# Patient Record
Sex: Female | Born: 1990 | Race: White | Hispanic: No | Marital: Single | Smoking: Current every day smoker
Health system: Southern US, Community
[De-identification: ages and names within clinical notes are randomized; demographics above are authoritative.]

## PROBLEM LIST (undated history)

## (undated) DIAGNOSIS — E109 Type 1 diabetes mellitus without complications: Secondary | ICD-10-CM

## (undated) DIAGNOSIS — F419 Anxiety disorder, unspecified: Secondary | ICD-10-CM

## (undated) HISTORY — PX: NO PAST SURGERIES: SHX2092

## (undated) HISTORY — PX: LAPAROSCOPY: SHX197

## (undated) HISTORY — PX: INDUCED ABORTION: SHX677

---
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
## 2010-03-10 ENCOUNTER — Ambulatory Visit: Payer: Self-pay | Admitting: Internal Medicine

## 2010-10-28 ENCOUNTER — Ambulatory Visit: Payer: Self-pay

## 2010-11-04 ENCOUNTER — Ambulatory Visit: Payer: Self-pay

## 2010-11-21 ENCOUNTER — Ambulatory Visit: Payer: Self-pay | Admitting: Internal Medicine

## 2010-12-11 IMAGING — CR LEFT GREAT TOE
1 series · 3 of 3 positions shown · non-contrast
Comparison: none

REASON FOR EXAM: injury, pain
COMMENTS:

PROCEDURE:     MDR - MDR TOE GREAT (1ST DIGIT) LEFT  - May 08, 2009  [DATE]
RESULT:     Three views of the left great toe reveal the bones to be
adequately mineralized. I do not see evidence of an acute fracture nor
dislocation. The overlying soft tissues are grossly normal.

[Series 1: view not recorded · 0.17mm/px · 3 of 3 slices shown]
[im 1/3]
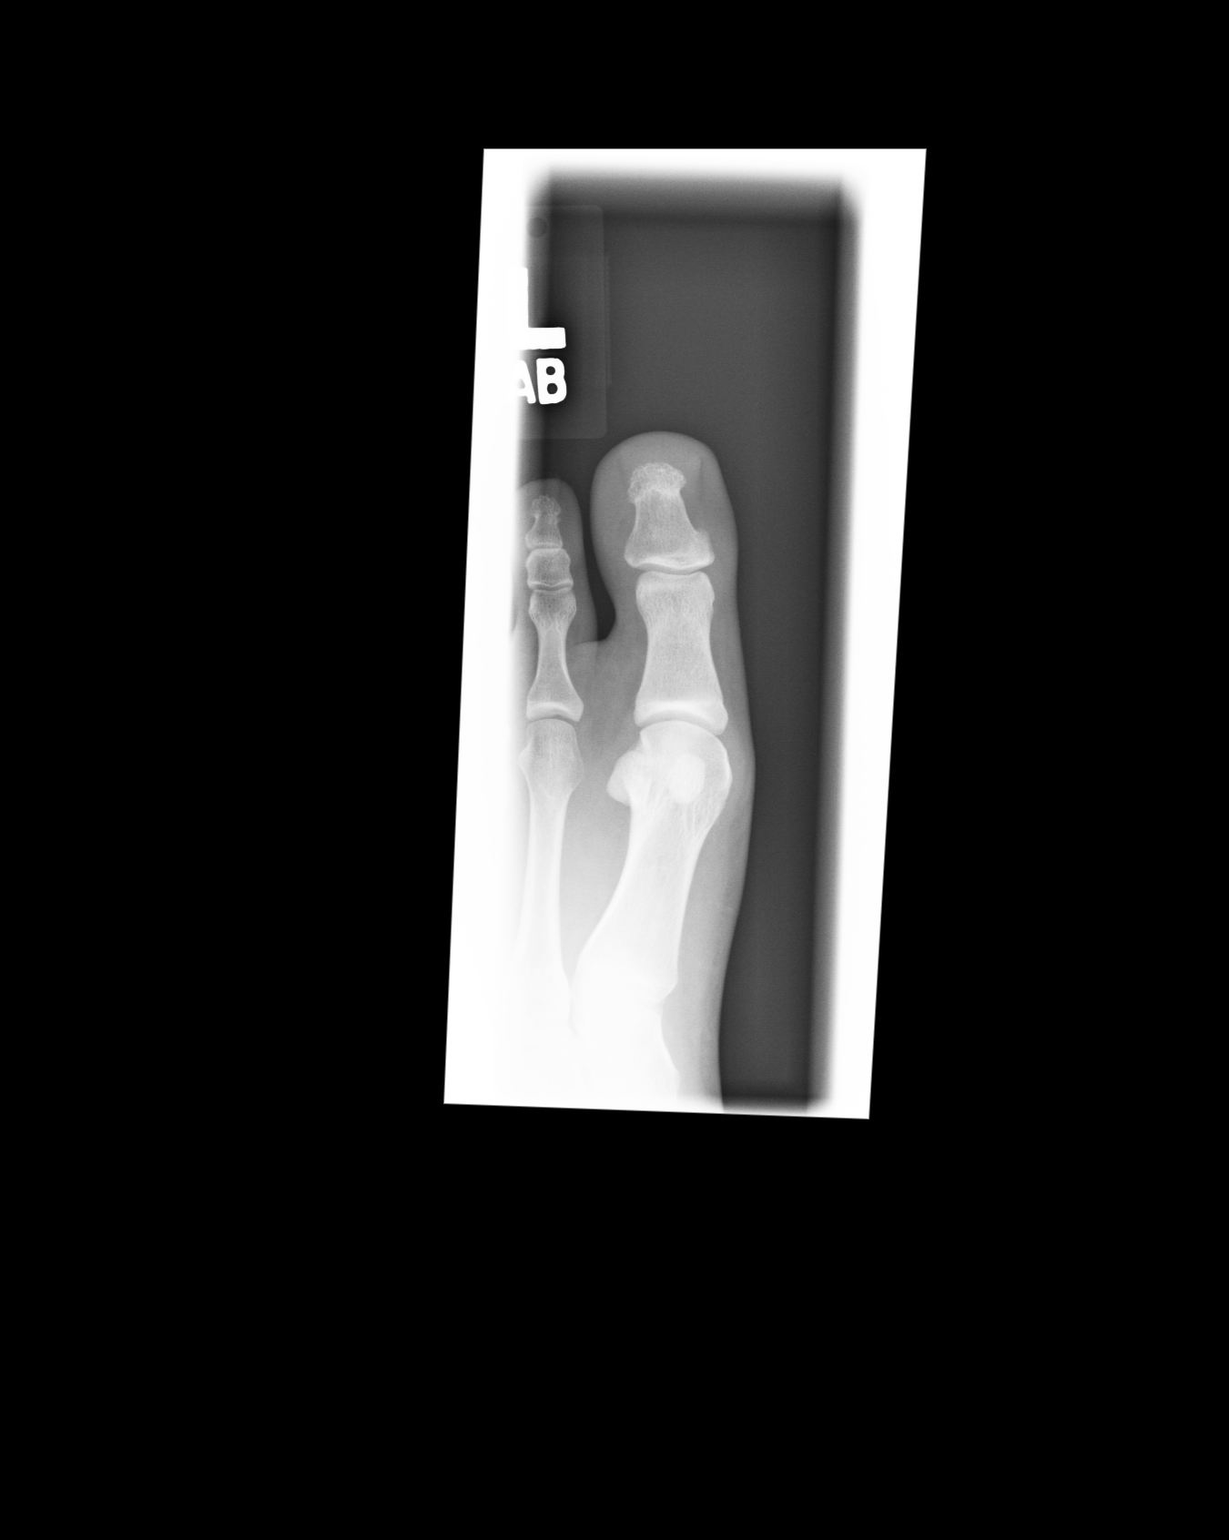
[im 2/3]
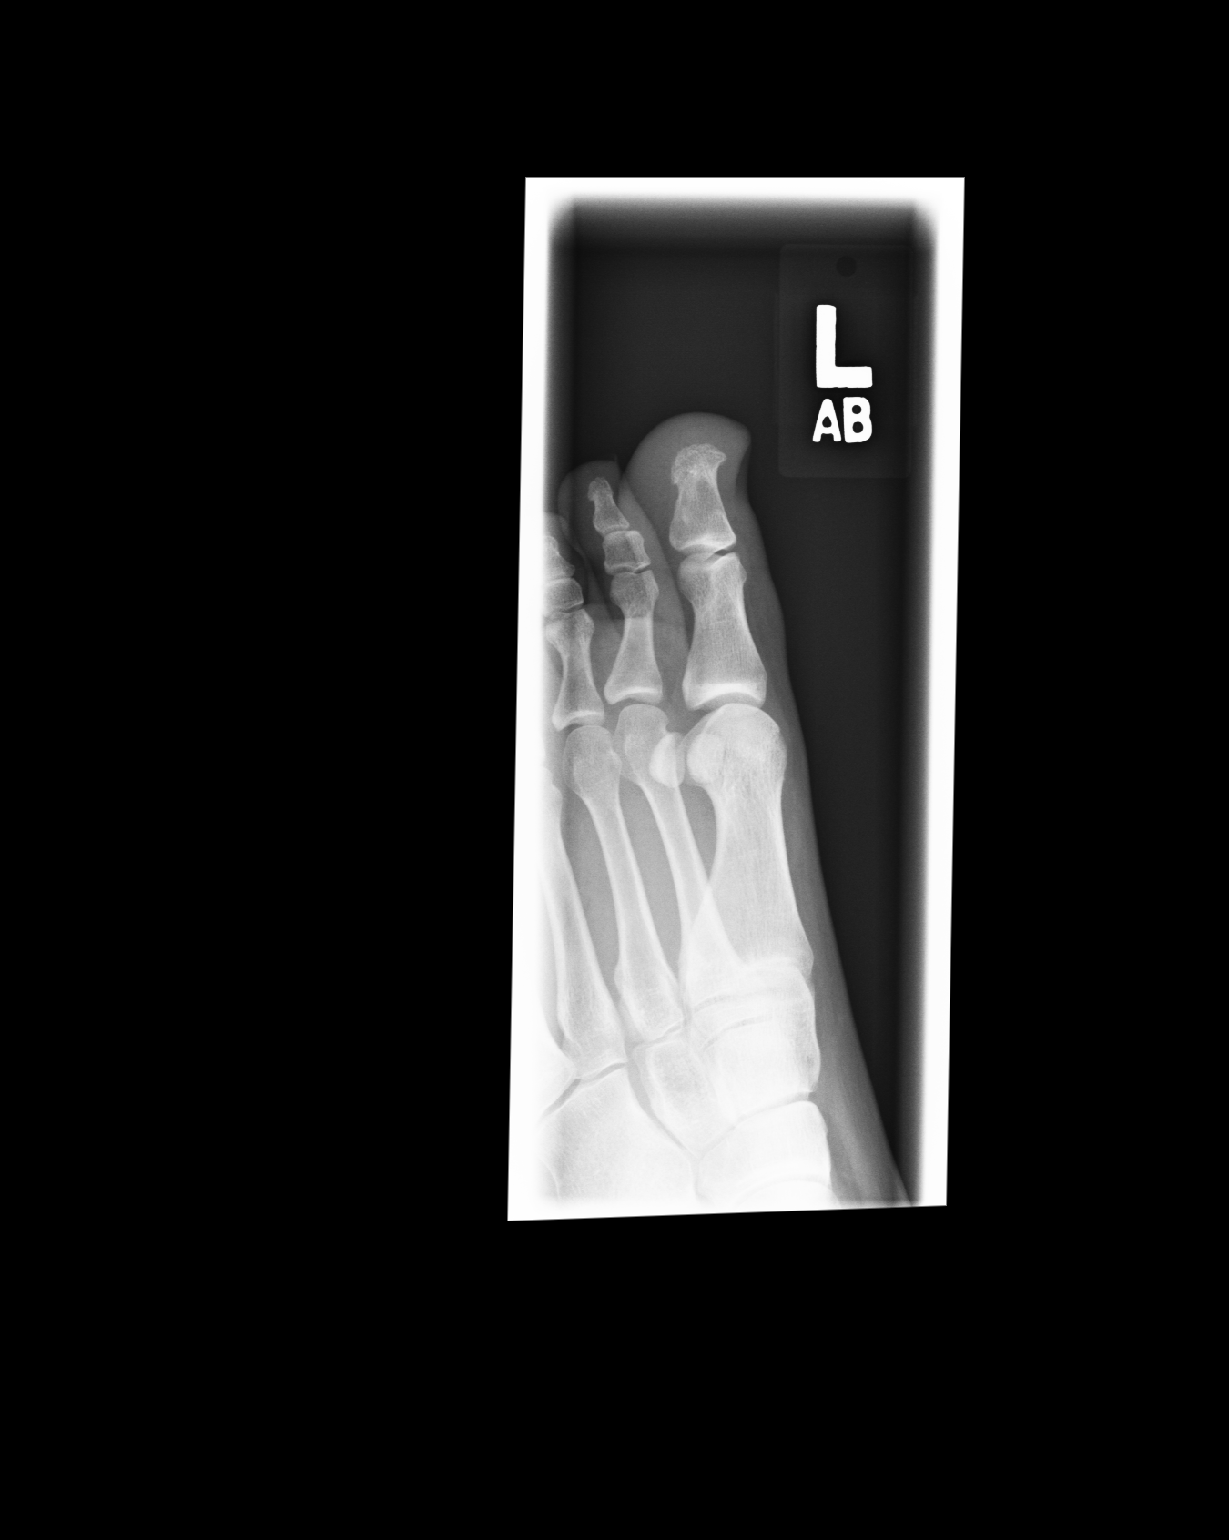
[im 3/3]
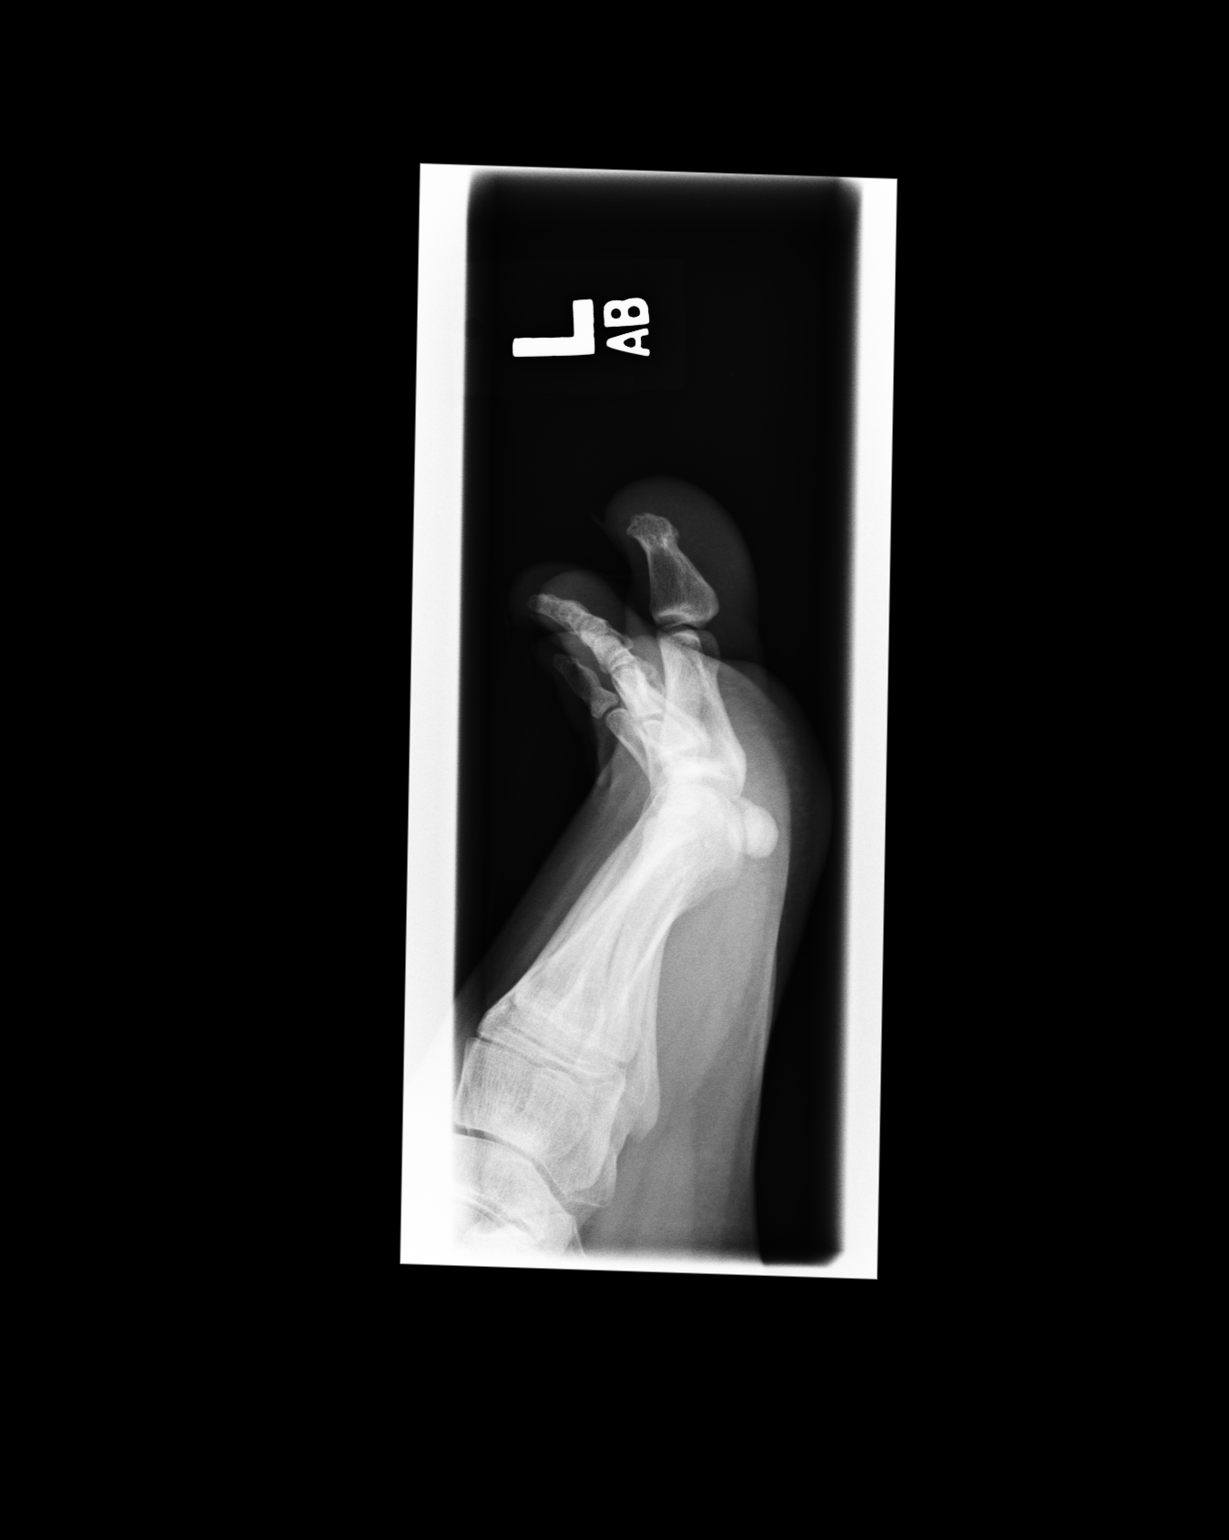

[3 of 3 positions shown; findings below may reference images not displayed]

IMPRESSION: I do not see acute bony abnormality of the great toe.

## 2011-07-01 LAB — URINALYSIS, ROUTINE W REFLEX MICROSCOPIC
Bilirubin Urine: NEGATIVE
Glucose, UA: >1000 — AB
Hgb urine dipstick: NEGATIVE
Ketones, ur: >80 — AB
Leukocytes, UA: NEGATIVE
Nitrite: NEGATIVE
Protein, ur: NEGATIVE
Specific Gravity, Urine: 1.031 — ABNORMAL HIGH
Urobilinogen, UA: 0.2
pH: 5.5

## 2011-07-01 LAB — CBC
HCT: 43.7
Hemoglobin: 15.6
RBC: 4.88
WBC: 12.1

## 2011-07-01 LAB — RAPID URINE DRUG SCREEN, HOSP PERFORMED
Amphetamines: NOT DETECTED
Barbiturates: NOT DETECTED
Benzodiazepines: NOT DETECTED
Cocaine: NOT DETECTED
Opiates: NOT DETECTED
Tetrahydrocannabinol: POSITIVE — AB

## 2011-07-01 LAB — POCT PREGNANCY, URINE
Operator id: 288331
Preg Test, Ur: NEGATIVE

## 2011-07-01 LAB — BASIC METABOLIC PANEL
Potassium: 3.9
Sodium: 140

## 2011-07-01 LAB — URINE CULTURE: Colony Count: 4000

## 2011-07-01 LAB — URINE MICROSCOPIC-ADD ON

## 2013-09-16 ENCOUNTER — Ambulatory Visit: Payer: Self-pay | Admitting: Family Medicine

## 2013-09-16 LAB — CBC WITH DIFFERENTIAL/PLATELET
Basophil #: 0 10*3/uL (ref 0.0–0.1)
Basophil %: 0.4 %
Eosinophil %: 1.1 %
HCT: 42.2 % (ref 35.0–47.0)
HGB: 14.8 g/dL (ref 12.0–16.0)
Lymphocyte %: 32.6 %
MCH: 30.7 pg (ref 26.0–34.0)
MCHC: 35 g/dL (ref 32.0–36.0)
Neutrophil #: 5 10*3/uL (ref 1.4–6.5)
Neutrophil %: 60.1 %
Platelet: 230 10*3/uL (ref 150–440)

## 2013-09-16 LAB — COMPREHENSIVE METABOLIC PANEL
Albumin: 3.8 g/dL (ref 3.4–5.0)
BUN: 13 mg/dL (ref 7–18)
Bilirubin,Total: 0.4 mg/dL (ref 0.2–1.0)
Co2: 30 mmol/L (ref 21–32)
Creatinine: 1.03 mg/dL (ref 0.60–1.30)
EGFR (African American): >60
EGFR (Non-African Amer.): >60
Glucose: 314 mg/dL — ABNORMAL HIGH (ref 65–99)
Total Protein: 7.3 g/dL (ref 6.4–8.2)

## 2013-09-16 LAB — URINALYSIS, COMPLETE
Ph: 6 (ref 4.5–8.0)
Protein: NEGATIVE

## 2013-09-16 LAB — PREGNANCY, URINE: Pregnancy Test, Urine: NEGATIVE m[IU]/mL

## 2014-04-27 ENCOUNTER — Ambulatory Visit: Payer: Self-pay | Admitting: Physician Assistant

## 2014-04-27 LAB — PREGNANCY, URINE: PREGNANCY TEST, URINE: NEGATIVE m[IU]/mL

## 2016-11-03 ENCOUNTER — Ambulatory Visit
Admission: EM | Admit: 2016-11-03 | Discharge: 2016-11-03 | Disposition: A | Discharge status: Home / Self Care | Payer: Medicaid Other | Attending: Family Medicine | Admitting: Family Medicine

## 2016-11-03 DIAGNOSIS — R69 Illness, unspecified: Secondary | ICD-10-CM | POA: Diagnosis not present

## 2016-11-03 DIAGNOSIS — J9801 Acute bronchospasm: Secondary | ICD-10-CM | POA: Diagnosis not present

## 2016-11-03 DIAGNOSIS — R110 Nausea: Secondary | ICD-10-CM

## 2016-11-03 DIAGNOSIS — J111 Influenza due to unidentified influenza virus with other respiratory manifestations: Secondary | ICD-10-CM

## 2016-11-03 HISTORY — DX: Type 1 diabetes mellitus without complications: E10.9

## 2016-11-03 MED ORDER — OSELTAMIVIR PHOSPHATE 75 MG PO CAPS: 75.0000 mg | ORAL_CAPSULE | Freq: Two times a day (BID) | ORAL | 0 refills | Status: DC

## 2016-11-03 MED ORDER — ALBUTEROL SULFATE HFA 108 (90 BASE) MCG/ACT IN AERS: 1.0000 | INHALATION_SPRAY | Freq: Four times a day (QID) | RESPIRATORY_TRACT | 0 refills | Status: DC | PRN

## 2016-11-03 MED ORDER — ONDANSETRON 8 MG PO TBDP: 8.0000 mg | ORAL_TABLET | Freq: Three times a day (TID) | ORAL | 0 refills | Status: DC | PRN

## 2016-11-03 NOTE — ED Provider Notes (Signed)
[facility]    CSN: [id number] Arrival date & time: 11/03/16  1524     History   Chief Complaint Chief Complaint  Patient presents with  . Cough    HPI Misty Nelson is a 26 y.o. female.    Cough  Associated symptoms: fever, myalgias and rhinorrhea   URI  Presenting symptoms: congestion, cough, fatigue, fever and rhinorrhea   Severity:  Moderate Onset quality:  Sudden Duration:  3 days Timing:  Constant Progression:  Worsening Chronicity:  New Relieved by:  None tried Ineffective treatments:  None tried Associated symptoms: myalgias   Risk factors: sick contacts (son diagnosed with flu)   Risk factors: not elderly, no chronic cardiac disease, no chronic kidney disease, no chronic respiratory disease, no diabetes mellitus, no immunosuppression, no recent illness and no recent travel     Past Medical History:  Diagnosis Date  . Type 1 diabetes (HCC)     There are no active problems to display for this patient.   Past Surgical History:  Procedure Laterality Date  . NO PAST SURGERIES      OB History    No data available       Home Medications    Prior to Admission medications   Medication Sig Start Date End Date Taking? Authorizing Provider  insulin aspart (NOVOLOG) 100 UNIT/ML injection Inject into the skin 3 (three) times daily before meals.   Yes Historical Provider, MD  insulin glargine (LANTUS) 100 UNIT/ML injection Inject 30 Units into the skin at bedtime.   Yes Historical Provider, MD  albuterol (PROVENTIL HFA;VENTOLIN HFA) 108 (90 Base) MCG/ACT inhaler Inhale 1-2 puffs into the lungs every 6 (six) hours as needed for wheezing or shortness of breath. 11/03/16   Payton Mccallum, MD  ondansetron (ZOFRAN ODT) 8 MG disintegrating tablet Take 1 tablet (8 mg total) by mouth every 8 (eight) hours as needed for nausea or vomiting. 11/03/16   Payton Mccallum, MD  oseltamivir (TAMIFLU) 75 MG capsule Take 1 capsule (75 mg total) by mouth 2 (two) times  daily. 11/03/16   Payton Mccallum, MD    Family History History reviewed. No pertinent family history.  Social History Social History  Substance Use Topics  . Smoking status: Current Every Day Smoker    Packs/day: 0.50  . Smokeless tobacco: Never Used  . Alcohol use No     Allergies   Patient has no known allergies.   Review of Systems Review of Systems  Constitutional: Positive for fatigue and fever.  HENT: Positive for congestion and rhinorrhea.   Respiratory: Positive for cough.   Musculoskeletal: Positive for myalgias.     Physical Exam Triage Vital Signs ED Triage Vitals  Enc Vitals Group     BP 11/03/16 1552 108/77     Pulse Rate 11/03/16 1552 80     Resp 11/03/16 1552 18     Temp 11/03/16 1552 97.9 F (36.6 C)     Temp Source 11/03/16 1552 Oral     SpO2 11/03/16 1552 95 %     Weight 11/03/16 1550 130 lb (59 kg)     Height 11/03/16 1550 5\' 1"  (1.549 m)     Head Circumference --      Peak Flow --      Pain Score 11/03/16 1552 7     Pain Loc --      Pain Edu? --      Excl. in GC? --    No data found.  Updated Vital Signs BP 108/77 (BP Location: Left Arm)   Pulse 80   Temp 97.9 F (36.6 C) (Oral)   Resp 18   Ht 5\' 1"  (1.549 m)   Wt 130 lb (59 kg)   LMP 10/13/2016   SpO2 95%   BMI 24.56 kg/m   Visual Acuity Right Eye Distance:   Left Eye Distance:   Bilateral Distance:    Right Eye Near:   Left Eye Near:    Bilateral Near:     Physical Exam  Constitutional: She appears well-developed and well-nourished. No distress.  HENT:  Head: Normocephalic and atraumatic.  Right Ear: Tympanic membrane, external ear and ear canal normal.  Left Ear: Tympanic membrane, external ear and ear canal normal.  Nose: Mucosal edema and rhinorrhea present. No nose lacerations, sinus tenderness, nasal deformity, septal deviation or nasal septal hematoma. No epistaxis.  No foreign bodies. Right sinus exhibits no maxillary sinus tenderness and no frontal sinus  tenderness. Left sinus exhibits no maxillary sinus tenderness and no frontal sinus tenderness.  Mouth/Throat: Uvula is midline, oropharynx is clear and moist and mucous membranes are normal. No oropharyngeal exudate.  Eyes: Conjunctivae and EOM are normal. Pupils are equal, round, and reactive to light. Right eye exhibits no discharge. Left eye exhibits no discharge. No scleral icterus.  Neck: Normal range of motion. Neck supple. No thyromegaly present.  Cardiovascular: Normal rate, regular rhythm and normal heart sounds.   Pulmonary/Chest: Effort normal and breath sounds normal. No respiratory distress. She has no wheezes. She has no rales.  Lymphadenopathy:    She has no cervical adenopathy.  Skin: She is not diaphoretic.  Nursing note and vitals reviewed.    UC Treatments / Results  Labs (all labs ordered are listed, but only abnormal results are displayed) Labs Reviewed - No data to display  EKG  EKG Interpretation None       Radiology No results found.  Procedures Procedures (including critical care time)  Medications Ordered in UC Medications - No data to display   Initial Impression / Assessment and Plan / UC Course  I have reviewed the triage vital signs and the nursing notes.  Pertinent labs & imaging results that were available during my care of the patient were reviewed by me and considered in my medical decision making (see chart for details).      Final Clinical Impressions(s) / UC Diagnoses   Final diagnoses:  Influenza-like illness  Bronchospasm  Nausea    New Prescriptions Discharge Medication List as of 11/03/2016  4:58 PM    START taking these medications   Details  albuterol (PROVENTIL HFA;VENTOLIN HFA) 108 (90 Base) MCG/ACT inhaler Inhale 1-2 puffs into the lungs every 6 (six) hours as needed for wheezing or shortness of breath., Starting Tue 11/03/2016, Normal    ondansetron (ZOFRAN ODT) 8 MG disintegrating tablet Take 1 tablet (8 mg total)  by mouth every 8 (eight) hours as needed for nausea or vomiting., Starting Tue 11/03/2016, Normal    oseltamivir (TAMIFLU) 75 MG capsule Take 1 capsule (75 mg total) by mouth 2 (two) times daily., Starting Tue 11/03/2016, Normal       1. diagnosis reviewed with patient 2. rx as per orders above; reviewed possible side effects, interactions, risks and benefits  3. Recommend supportive treatment with rest, fluids 4. Follow-up prn if symptoms worsen or don't improve   Payton Mccallumrlando Nissi Doffing, MD 11/03/16 1731

## 2016-11-03 NOTE — ED Triage Notes (Signed)
Pt c/o cough, sore throat, nausea that started 3 days ago. Patient states that son has the flu.

## 2016-11-06 ENCOUNTER — Encounter: Payer: Self-pay | Admitting: Gynecology

## 2016-11-06 ENCOUNTER — Ambulatory Visit
Admission: EM | Admit: 2016-11-06 | Discharge: 2016-11-06 | Disposition: A | Discharge status: Home / Self Care | Payer: Medicaid Other | Attending: Family Medicine | Admitting: Family Medicine

## 2016-11-06 DIAGNOSIS — J029 Acute pharyngitis, unspecified: Secondary | ICD-10-CM | POA: Insufficient documentation

## 2016-11-06 DIAGNOSIS — R197 Diarrhea, unspecified: Secondary | ICD-10-CM | POA: Diagnosis not present

## 2016-11-06 DIAGNOSIS — Z794 Long term (current) use of insulin: Secondary | ICD-10-CM | POA: Diagnosis not present

## 2016-11-06 DIAGNOSIS — R51 Headache: Secondary | ICD-10-CM | POA: Diagnosis not present

## 2016-11-06 DIAGNOSIS — B338 Other specified viral diseases: Secondary | ICD-10-CM | POA: Insufficient documentation

## 2016-11-06 DIAGNOSIS — Z79899 Other long term (current) drug therapy: Secondary | ICD-10-CM | POA: Insufficient documentation

## 2016-11-06 DIAGNOSIS — F17210 Nicotine dependence, cigarettes, uncomplicated: Secondary | ICD-10-CM | POA: Insufficient documentation

## 2016-11-06 DIAGNOSIS — R6889 Other general symptoms and signs: Secondary | ICD-10-CM

## 2016-11-06 LAB — RAPID STREP SCREEN (MED CTR MEBANE ONLY): Streptococcus, Group A Screen (Direct): NEGATIVE

## 2016-11-06 MED ORDER — BENZONATATE 200 MG PO CAPS: 200.0000 mg | ORAL_CAPSULE | Freq: Three times a day (TID) | ORAL | 0 refills | Status: DC

## 2016-11-06 MED ORDER — ONDANSETRON 8 MG PO TBDP: 8.0000 mg | ORAL_TABLET | Freq: Three times a day (TID) | ORAL | 0 refills | Status: DC | PRN

## 2016-11-06 NOTE — ED Triage Notes (Signed)
Per patient with sore throat and headache . Per patient was seen x 3 days ago.

## 2016-11-06 NOTE — ED Provider Notes (Signed)
CSN: [id number]     Arrival date & time 11/06/16  1603 History   First MD Initiated Contact with Patient 11/06/16 1702     Chief Complaint  Patient presents with  . Sore Throat  . Headache   (Consider location/radiation/quality/duration/timing/severity/associated sxs/prior Treatment) HPI  A 26 year old female who was seen here 3 days ago diagnosed with a flulike illness prescribed Tamiflu and Zofran because of nausea she states that she has noticed some improvement since taking the Tamiflu but today awoke with diarrhea and worsening of her coughing. 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 Continues to have the nausea and cough.     Past Medical History:  Diagnosis Date  . Type 1 diabetes [facility](HCC)    Past Surgical History:  Procedure Laterality Date  . NO PAST SURGERIES     No family history on file. Social History  Substance Use Topics  . Smoking status: Current Every Day Smoker    Packs/day: 0.50  . Smokeless tobacco: Never Used  . Alcohol use No   OB History    No data available     Review of Systems  Constitutional: Positive for activity change and appetite change. Negative for chills, fatigue and fever.  HENT: Positive for congestion and postnasal drip.   Respiratory: Positive for cough. Negative for shortness of breath, wheezing and stridor.   Gastrointestinal: Positive for diarrhea and nausea.  All other systems reviewed and are negative.   Allergies  Patient has no known allergies.  Home Medications   Prior to Admission medications   Medication Sig Start Date End Date Taking? Authorizing Provider  albuterol (PROVENTIL HFA;VENTOLIN HFA) 108 (90 Base) MCG/ACT inhaler Inhale 1-2 puffs into the lungs every 6 (six) hours as needed for wheezing or shortness of breath. 11/03/16  Yes Payton Mccallumrlando Conty, MD  Etonogestrel [facility](NEXPLANON [place]) Inject into the skin.   Yes Historical Provider, MD  insulin aspart (NOVOLOG) 100 UNIT/ML injection Inject into  the skin 3 (three) times daily before meals.   Yes Historical Provider, MD  insulin glargine (LANTUS) 100 UNIT/ML injection Inject 30 Units into the skin at bedtime.   Yes Historical Provider, MD  oseltamivir (TAMIFLU) 75 MG capsule Take 1 capsule (75 mg total) by mouth 2 (two) times daily. 11/03/16  Yes Payton Mccallumrlando Conty, MD  benzonatate (TESSALON) 200 MG capsule Take 1 capsule (200 mg total) by mouth every 8 (eight) hours. As necessary for cough 11/06/16   Lutricia FeilWilliam P Lorene Klimas, PA-C  ondansetron (ZOFRAN ODT) 8 MG disintegrating tablet Take 1 tablet (8 mg total) by mouth every 8 (eight) hours as needed for nausea or vomiting. 11/06/16   Lutricia FeilWilliam P Faiz Weber, PA-C   Meds Ordered and Administered this Visit  Medications - No data to display  BP 116/71 (BP Location: Left Arm)   Pulse 87   Temp 98 F (36.7 C) (Oral)   Resp 16   Ht 5\' 1"  (1.549 m)   Wt 130 lb (59 kg)   LMP 10/13/2016   SpO2 98%   BMI 24.56 kg/m  No data found.   Physical Exam  Constitutional: She is oriented to person, place, and time. She appears well-developed and well-nourished. No distress.  HENT:  Head: Normocephalic and atraumatic.  Right Ear: External ear normal.  Left Ear: External ear normal.  Nose: Nose normal.  Mouth/Throat: No oropharyngeal exudate.  Eyes: EOM are normal. Pupils are equal, round, and reactive to light. Right eye exhibits discharge. Left eye exhibits  discharge.  Neck: Normal range of motion. Neck supple.  Pulmonary/Chest: Effort normal and breath sounds normal. No respiratory distress. She has no wheezes. She has no rales.  Musculoskeletal: Normal range of motion.  Lymphadenopathy:    She has no cervical adenopathy.  Neurological: She is alert and oriented to person, place, and time.  Skin: Skin is warm and dry. She is not diaphoretic.  Psychiatric: She has a normal mood and affect. Her behavior is normal. Judgment and thought content normal.  Nursing note and vitals reviewed.   Urgent Care Course      Procedures (including critical care time)  Labs Review Labs Reviewed  RAPID STREP SCREEN (NOT AT [facility])  CULTURE, GROUP A STREP [facility])    Imaging Review No results found.   Visual Acuity Review  Right Eye Distance:   Left Eye Distance:   Bilateral Distance:    Right Eye Near:   Left Eye Near:    Bilateral Near:         MDM   1. Flu-like symptoms    Discharge Medication List as of 11/06/2016  5:16 PM    START taking these medications   Details  benzonatate (TESSALON) 200 MG capsule Take 1 capsule (200 mg total) by mouth every 8 (eight) hours. As necessary for cough, Starting Fri 11/06/2016, Normal      Plan: 1. Test/x-ray results and diagnosis reviewed with patient 2. rx as per orders; risks, benefits, potential side effects reviewed with patient 3. Recommend supportive treatment with Rest and fluids. Was told that the flu can last longer than what she anticipated. I will provide her with Zofran for the nausea and Tessalon Perles for the cough. 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 4. F/u prn if symptoms worsen or don't improve     Lutricia Feil, PA-C 11/06/16 [address] [place], [place] 11/06/16 [phone]

## 2016-11-08 ENCOUNTER — Telehealth: Payer: Self-pay | Admitting: Internal Medicine

## 2016-11-08 LAB — CULTURE, GROUP A STREP (THRC)

## 2016-11-08 MED ORDER — PENICILLIN V POTASSIUM 500 MG PO TABS: 500.0000 mg | ORAL_TABLET | Freq: Two times a day (BID) | ORAL | 0 refills | Status: DC

## 2016-11-08 NOTE — Telephone Encounter (Signed)
Clinical staff, please let patient know that throat culture was positive for a few non group A strep.  Not clear that this is causing her symptoms, but rx penicillin V sent to pharmacy of record, [facility] in [place].  Recheck for further evaluation if not improving.  LM

## 2016-11-09 ENCOUNTER — Telehealth: Payer: Self-pay

## 2016-11-09 ENCOUNTER — Telehealth: Payer: Self-pay | Admitting: *Deleted

## 2016-11-09 MED ORDER — PENICILLIN V POTASSIUM 500 MG PO TABS: 500.0000 mg | ORAL_TABLET | Freq: Two times a day (BID) | ORAL | 0 refills | Status: DC

## 2016-11-09 NOTE — Telephone Encounter (Signed)
Patient's representative called requesting to have Penicillin [facility] [place]. Patient chart shows [facility] [place]. [facility] reported not receiving the 1st prescription. Prescription was resent.

## 2016-11-09 NOTE — Telephone Encounter (Signed)
Spoke to patient and she still isn't feeling better, I informed her of medication to pick up and to start taking.

## 2016-11-09 NOTE — Telephone Encounter (Signed)
-----   Message from Eustace MooreLaura W Murray, MD sent at 11/08/2016  8:50 PM EST ----- Clinical staff, please let patient know that throat culture was positive for a few non group A strep.  Not clear that this is causing her symptoms, but rx penicillin V sent to pharmacy of record, [facility] [place].  Recheck for further evaluation if not improving.  LM

## 2017-05-25 ENCOUNTER — Ambulatory Visit
Admission: EM | Admit: 2017-05-25 | Discharge: 2017-05-25 | Disposition: A | Discharge status: Hospice | Payer: Medicaid Other | Attending: Family Medicine | Admitting: Family Medicine

## 2017-05-25 DIAGNOSIS — F17210 Nicotine dependence, cigarettes, uncomplicated: Secondary | ICD-10-CM | POA: Diagnosis not present

## 2017-05-25 DIAGNOSIS — Z794 Long term (current) use of insulin: Secondary | ICD-10-CM | POA: Insufficient documentation

## 2017-05-25 DIAGNOSIS — E1069 Type 1 diabetes mellitus with other specified complication: Secondary | ICD-10-CM

## 2017-05-25 DIAGNOSIS — R1011 Right upper quadrant pain: Secondary | ICD-10-CM | POA: Diagnosis not present

## 2017-05-25 DIAGNOSIS — E109 Type 1 diabetes mellitus without complications: Secondary | ICD-10-CM | POA: Diagnosis not present

## 2017-05-25 DIAGNOSIS — R1013 Epigastric pain: Secondary | ICD-10-CM | POA: Insufficient documentation

## 2017-05-25 DIAGNOSIS — R109 Unspecified abdominal pain: Secondary | ICD-10-CM | POA: Diagnosis present

## 2017-05-25 LAB — URINALYSIS, COMPLETE (UACMP) WITH MICROSCOPIC
Glucose, UA: 500 mg/dL — AB
Hgb urine dipstick: NEGATIVE
Ketones, ur: >160 mg/dL — AB
Leukocytes, UA: NEGATIVE
Nitrite: NEGATIVE
PH: 5.5 (ref 5.0–8.0)
PROTEIN: 30 mg/dL — AB
Specific Gravity, Urine: >1.03 — ABNORMAL HIGH (ref 1.005–1.030)

## 2017-05-25 LAB — GLUCOSE, CAPILLARY: GLUCOSE-CAPILLARY: 273 mg/dL — AB (ref 65–99)

## 2017-05-25 LAB — PREGNANCY, URINE: Preg Test, Ur: NEGATIVE

## 2017-05-25 MED ORDER — ONDANSETRON 8 MG PO TBDP
8.0000 mg | ORAL_TABLET | Freq: Once | ORAL | Status: AC
Start: 2017-05-25 — End: 2017-05-25
  Administered 2017-05-25: 8 mg via ORAL

## 2017-05-25 MED ORDER — SODIUM CHLORIDE 0.9 % IV BOLUS (SEPSIS): 1000.0000 mL | Freq: Once | INTRAVENOUS | Status: DC

## 2017-05-25 NOTE — ED Provider Notes (Signed)
[facility]    CSN: [id number] Arrival date & time: 05/25/17  4098     History   Chief Complaint Chief Complaint  Patient presents with  . Abdominal Pain    HPI Misty Nelson is a 26 y.o. female.   HPI  This a 27 year old female type I diabetic who presents with epigastric abdominal pain has been constant for 2-3 days. States that she has been vomiting daily and has not eaten in several days. It always seems worse in the morning or with  any eating. She's had no diarrhea and no constipation. Her last normal bowel movement was 3 days ago which is not unusual for her. He has been giving herself  small amounts of insulin to keep her blood sugars below 300 which is what she normally does when she is ill. She has severe nausea and the pain which prompted her visit today. She has had no fever or chills. She is afebrile presently. She drank ginger ale this morning.        Past Medical History:  Diagnosis Date  . Type 1 diabetes (HCC)     There are no active problems to display for this patient.   Past Surgical History:  Procedure Laterality Date  . NO PAST SURGERIES      OB History    No data available       Home Medications    Prior to Admission medications   Medication Sig Start Date End Date Taking? Authorizing Provider  Etonogestrel (NEXPLANON [place]) Inject into the skin.   Yes [provider]  insulin aspart (NOVOLOG) 100 UNIT/ML injection Inject into the skin 3 (three) times daily before meals.   Yes [provider]  insulin glargine (LANTUS) 100 UNIT/ML injection Inject 30 Units into the skin at bedtime.   Yes [provider]  albuterol (PROVENTIL HFA;VENTOLIN HFA) 108 (90 Base) MCG/ACT inhaler Inhale 1-2 puffs into the lungs every 6 (six) hours as needed for wheezing or shortness of breath. 11/03/16   Payton Mccallum, MD  benzonatate (TESSALON) 200 MG capsule Take 1 capsule (200 mg total) by mouth every 8 (eight) hours. As  necessary for cough 11/06/16   Ovid Curd P, PA-C  ondansetron (ZOFRAN ODT) 8 MG disintegrating tablet Take 1 tablet (8 mg total) by mouth every 8 (eight) hours as needed for nausea or vomiting. 11/06/16   Lutricia Feil, PA-C  oseltamivir (TAMIFLU) 75 MG capsule Take 1 capsule (75 mg total) by mouth 2 (two) times daily. 11/03/16   Payton Mccallum, MD  penicillin v potassium (VEETID) 500 MG tablet Take 1 tablet (500 mg total) by mouth 2 (two) times daily. 11/09/16   Payton Mccallum, MD    Family History History reviewed. No pertinent family history.  Social History Social History  Substance Use Topics  . Smoking status: Current Every Day Smoker    Packs/day: 0.50  . Smokeless tobacco: Never Used  . Alcohol use No     Allergies   Patient has no known allergies.   Review of Systems Review of Systems  Constitutional: Positive for activity change and appetite change. Negative for chills, fatigue and fever.  Gastrointestinal: Positive for abdominal pain, nausea and vomiting. Negative for abdominal distention, anal bleeding, blood in stool, constipation and diarrhea.  All other systems reviewed and are negative.    Physical Exam Triage Vital Signs ED Triage Vitals  Enc Vitals Group     BP 05/25/17 0853 120/65     Pulse  Rate 05/25/17 0853 72     Resp 05/25/17 0853 18     Temp 05/25/17 0853 98.6 F (37 C)     Temp Source 05/25/17 0853 Oral     SpO2 05/25/17 0853 100 %     Weight 05/25/17 0849 140 lb (63.5 kg)     Height 05/25/17 0849 5\' 1"  (1.549 m)     Head Circumference --      Peak Flow --      Pain Score 05/25/17 0849 10     Pain Loc --      Pain Edu? --      Excl. in GC? --    No data found.   Updated Vital Signs BP 120/65 (BP Location: Right Arm)   Pulse 72   Temp 98.6 F (37 C) (Oral)   Resp 18   Ht 5\' 1"  (1.549 m)   Wt 140 lb (63.5 kg)   LMP 04/04/2017   SpO2 100%   BMI 26.45 kg/m   Visual Acuity Right Eye Distance:   Left Eye Distance:   Bilateral  Distance:    Right Eye Near:   Left Eye Near:    Bilateral Near:     Physical Exam  Constitutional: She is oriented to person, place, and time. She appears well-developed and well-nourished. No distress.  HENT:  Head: Normocephalic.  Eyes: Pupils are equal, round, and reactive to light. Right eye exhibits no discharge. Left eye exhibits no discharge.  Neck: Normal range of motion.  Pulmonary/Chest: Effort normal and breath sounds normal.  Abdominal: Soft. Bowel sounds are normal. She exhibits no distension and no mass. There is tenderness. There is guarding. There is no rebound. No hernia.  Examination of the abdomen shows no distention. Normal bowel sounds. Tenderness is sharply localized to the right upper quadrant where she also demonstrates a positive Murphy's sign. She has less pain in the epigastric area. There is mild guarding but no rebound.  Musculoskeletal: Normal range of motion.  Neurological: She is alert and oriented to person, place, and time.  Skin: Skin is warm and dry. She is not diaphoretic.  Psychiatric: She has a normal mood and affect. Her behavior is normal. Judgment and thought content normal.  Nursing note and vitals reviewed.    UC Treatments / Results  Labs (all labs ordered are listed, but only abnormal results are displayed) Labs Reviewed  URINALYSIS, COMPLETE (UACMP) WITH MICROSCOPIC - Abnormal; Notable for the following:       Result Value   APPearance HAZY (*)    Specific Gravity, Urine >1.030 (*)    Glucose, UA 500 (*)    Bilirubin Urine SMALL (*)    Ketones, ur >160 (*)    Protein, ur 30 (*)    Squamous Epithelial / LPF 0-5 (*)    Bacteria, UA FEW (*)    All other components within normal limits  GLUCOSE, CAPILLARY - Abnormal; Notable for the following:    Glucose-Capillary 273 (*)    All other components within normal limits  PREGNANCY, URINE  CBG MONITORING, ED    EKG  EKG Interpretation None       Radiology No results  found.  Procedures Procedures (including critical care time)  Medications Ordered in UC Medications  ondansetron (ZOFRAN-ODT) disintegrating tablet 8 mg (8 mg Oral Given 05/25/17 0859)     Initial Impression / Assessment and Plan / UC Course  I have reviewed the triage vital signs and the nursing notes.  Pertinent  labs & imaging results that were available during my care of the patient were reviewed by me and considered in my medical decision making (see chart for details).    Patient was going to be scheduled for a ultrasound and other blood work but after the results of her urine showed a high ketosis  In a type I diabetic, It was therefore decided that she should go to a higher level of care for fluids and evaluation. Patient opted to go to [facility] emergency department since most of her records reside there. She was transported by privately owned vehicle by her grandmother. She left our facility in stable condition.   Final Clinical Impressions(s) / UC Diagnoses   Final diagnoses:  Right upper quadrant abdominal pain  Type 1 diabetes mellitus with other specified complication [facility])    New Prescriptions Discharge Medication List as of 05/25/2017 10:25 AM       Controlled Substance Prescriptions [place] Controlled Substance Registry consulted? Not Applicable   Lutricia Feil, PA-C 05/25/17 1032

## 2017-05-25 NOTE — ED Triage Notes (Signed)
Patient complains of mid upper abdominal pain that has been constant x 2-3 days. Patient reports that she has been vomiting daily, worse in the mornings and is unable to eat. Patient reports that she is a type 1 diabetic and blood sugar readings have been good. Patient reports that she has severe nausea.

## 2018-06-02 ENCOUNTER — Other Ambulatory Visit: Payer: Self-pay

## 2018-06-02 ENCOUNTER — Ambulatory Visit
Admission: EM | Admit: 2018-06-02 | Discharge: 2018-06-02 | Disposition: A | Discharge status: Home / Self Care | Payer: Medicaid Other | Attending: Family Medicine | Admitting: Family Medicine

## 2018-06-02 ENCOUNTER — Other Ambulatory Visit: Payer: Self-pay | Admitting: Family Medicine

## 2018-06-02 ENCOUNTER — Encounter: Payer: Self-pay | Admitting: Emergency Medicine

## 2018-06-02 DIAGNOSIS — M79641 Pain in right hand: Secondary | ICD-10-CM

## 2018-06-02 MED ORDER — MELOXICAM 15 MG PO TABS: 15.0000 mg | ORAL_TABLET | Freq: Every day | ORAL | 0 refills | Status: DC | PRN

## 2018-06-02 NOTE — ED Triage Notes (Signed)
Patient c/o left hand and right hand pain and numbness that started yesterday.  Patient reports history of carpel tunnel.

## 2018-06-02 NOTE — ED Provider Notes (Signed)
[facility]    CSN: [id number] Arrival date & time: 06/02/18  1344  History   Chief Complaint Chief Complaint  Patient presents with  . Hand Pain    left   HPI   27 year old female presents with right hand pain/numbness/tingling.  Patient states that she is previously been diagnosed with carpal tunnel syndrome.  Patient states that she has had ongoing issues.  Most recently as of yesterday she has had worsening pain, and numbness.  She reports that the hand feels swollen.  She reports numbness of the right fourth and fifth digits.  She reports moderate to severe pain.  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  No relieving factors.  No medications or interventions tried.  No other associated symptoms.  No other complaints.  PMH, Surgical Hx, Family Hx, Social History reviewed and updated as below.  PMH: Nexplanon insertion 02/09/2017  Last Assessment & Plan:   Time out performed with nurse. Patient was placed supine with her left arm flexed at the elbow and externally rotated so that her wrist was parallel to her ear. The medial epicondyle of the left arm was identified and the insertion site was marked approximately 10 cm proximal to the medial epicondyle. The insertion site was cleaned with betadine x3. The entrance area and planned tract of the Nexplanon was then injected with 2 mL of 2% lidocaine with epinephrine. The sterile preloaded disposable Nexaplanon applicator was removed from the sterile packaging and the implant was viewed to be present within the needle. The applicator needle was inserted at a 30 degree angle at 10 cm proximal to the medial epicondyle as marked. The applicator was then lowered to a horizontal position and advanced just under the skin for the full length of the needle. The slider on the applicator was retracted fully while the applicator remained in the same position, then the applicator was removed. The Nexplanon implant (Lot ,  Exp ) was confirmed via palpation as being in position; presence of the implant was confirmed by the patient. The insertion site was covered with steri-strips and and a pressure dressing was applied. Patient tolerated the procedure well.    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 Provided home care instructions and return precautions.   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 Will consider derm referral if no improvement.   GAD (generalized anxiety disorder) 01/26/2017  Last Assessment & Plan:   See MDD tab   Carpal tunnel syndrome of right wrist 01/26/2017  Last Assessment & Plan:   Start nighttime splint. Provided home care instructions, HEP, and return precautions. Will consider hand surgery referral if no improvement.   Non morbid obesity 01/26/2017  Tobacco use disorder 09/13/2013  Last Assessment & Plan:   0.5ppd, down from 1ppd (39yr hx). Started cutting down about 6 months ago. Finally ready to quit. Discussed importance of quitting, benefits of cessation, and implications of continued use. Briefly discussed pharmacotherapy today and will consider on f/u visit. Provided PI.  I advised the patient of the risks in continuing to use tobacco, provided patient with smoking cessation educational materials as appropriate and discussed how to quit smoking and patient expressed the willingness to quit, x more than 10 min.   Subclinical hypothyroidism 05/31/2013  Last Assessment & Plan:   Formatting of this note might be different from  the original. Will defer to endo. Not on meds at this time.  Lab Results  Component Value Date  TSH 4.700 (H) 11/26/2016      Type 1 diabetes mellitus 01/17/2013  Last Assessment & Plan:   Formatting of this note might be different from the original. Will defer to endocrine.  Will address DM measures on f/u visit. Currently taking Lantus 30 units qhs and novolog SS 1:50 for BG>150, 1:2 10g carbs   Lab Results  Component Value Date  A1C 7.8 (H) 11/26/2016      Current moderate episode of major depressive disorder without prior episode 12/31/2011  Last Assessment & Plan:   Restart lexapro 10mg  daily. CBT not discussed today. PHQ-9 Score: Clinic Collected PHQ-9 Total Score : 19 GAD-7 Score:      Past Surgical History:  Procedure Laterality Date  . NO PAST SURGERIES     OB History   None    Home Medications    Prior to Admission medications   Medication Sig Start Date End Date Taking? Authorizing Provider  escitalopram (LEXAPRO) 20 MG tablet TK 1 T PO D 03/01/18  Yes [provider]  Etonogestrel (NEXPLANON [place]) Inject into the skin.   Yes [provider]  insulin aspart (NOVOLOG) 100 UNIT/ML injection Inject into the skin 3 (three) times daily before meals.   Yes [provider]  insulin glargine (LANTUS) 100 UNIT/ML injection Inject 30 Units into the skin at bedtime.   Yes [provider]  albuterol (PROVENTIL HFA;VENTOLIN HFA) 108 (90 Base) MCG/ACT inhaler Inhale 1-2 puffs into the lungs every 6 (six) hours as needed for wheezing or shortness of breath. 11/03/16   Payton Mccallum, MD  meloxicam (MOBIC) 15 MG tablet Take 1 tablet (15 mg total) by mouth daily as needed. 06/02/18   Tommie Sams, DO  ondansetron (ZOFRAN ODT) 8 MG disintegrating tablet Take 1 tablet (8 mg total) by mouth every 8 (eight) hours as needed for nausea or vomiting. 11/06/16   Lutricia Feil, PA-C    Family History Cancer Maternal Grandfather    Gout Maternal Grandfather     Social History Social History   Tobacco Use  . Smoking status: Current Every Day Smoker    Packs/day: 0.50  . Smokeless tobacco: Never Used  Substance Use Topics  . Alcohol use: No  . Drug use: No     Allergies   Patient has no known  allergies.   Review of Systems Review of Systems  Constitutional: Negative.   Musculoskeletal:       Right hand pain, swelling, numbness.   Physical Exam Triage Vital Signs ED Triage Vitals  Enc Vitals Group     BP 06/02/18 1403 105/79     Pulse Rate 06/02/18 1403 79     Resp 06/02/18 1403 16     Temp 06/02/18 1403 98.2 F (36.8 C)     Temp Source 06/02/18 1403 Oral     SpO2 06/02/18 1403 98 %     Weight 06/02/18 1401 140 lb (63.5 kg)     Height 06/02/18 1401 5\' 1"  (1.549 m)     Head Circumference --      Peak Flow --      Pain Score 06/02/18 1400 8     Pain Loc --      Pain Edu? --      Excl. in GC? --    Updated Vital Signs BP 105/79 (BP Location: Left Arm)   Pulse 79  Temp 98.2 F (36.8 C) (Oral)   Resp 16   Ht 5\' 1"  (1.549 m)   Wt 63.5 kg   SpO2 98%   BMI 26.45 kg/m   Visual Acuity Right Eye Distance:   Left Eye Distance:   Bilateral Distance:    Right Eye Near:   Left Eye Near:    Bilateral Near:     Physical Exam  Constitutional: She is oriented to person, place, and time. She appears well-developed. No distress.  Cardiovascular: Normal rate and regular rhythm.  Pulmonary/Chest: Effort normal and breath sounds normal.  Musculoskeletal:  No apparent swelling, tenderness of the right hand and digits.  Negative Tinel's and Phalen's.  Neurological: She is alert and oriented to person, place, and time.  Psychiatric: She has a normal mood and affect. Her behavior is normal.  Nursing note and vitals reviewed.  UC Treatments / Results  Labs (all labs ordered are listed, but only abnormal results are displayed) Labs Reviewed - No data to display  EKG None  Radiology No results found.  Procedures Procedures (including critical care time)  Medications Ordered in UC Medications - No data to display  Initial Impression / Assessment and Plan / UC Course  I have reviewed the triage vital signs and the nursing notes.  Pertinent labs & imaging  results that were available during my care of the patient were reviewed by me and considered in my medical decision making (see chart for details).    27 year old female presents with right hand pain/numbness.  The distribution of her numbness is not consistent with carpal tunnel.  She does carry a prior diagnosis of carpal tunnel.  Advised the patient that she needs a nerve conduction study.  Meloxicam as needed.  Rest, supportive care.  Work note given.  Final Clinical Impressions(s) / UC Diagnoses   Final diagnoses:  Right hand pain     Discharge Instructions     Rest, ice, elevate.  Use the medication as needed.  You need a Nerve conduction study. Discuss with your primary.  Take care  Dr. Adriana Simasook    ED Prescriptions    Medication Sig Dispense Auth. Provider   meloxicam (MOBIC) 15 MG tablet Take 1 tablet (15 mg total) by mouth daily as needed. 30 tablet Tommie Samsook, Hanley Rispoli G, DO     Controlled Substance Prescriptions [place] Controlled Substance Registry consulted? Not Applicable   Tommie SamsCook, Rahkeem Senft G, DO 06/02/18 1427

## 2018-06-02 NOTE — Discharge Instructions (Signed)
Rest, ice, elevate.  Use the medication as needed.  You need a Nerve conduction study. Discuss with your primary.  Take care  Dr. Adriana Simasook

## 2018-07-19 ENCOUNTER — Other Ambulatory Visit: Payer: Self-pay

## 2018-07-19 ENCOUNTER — Encounter: Payer: Self-pay | Admitting: Emergency Medicine

## 2018-07-19 ENCOUNTER — Ambulatory Visit
Admission: EM | Admit: 2018-07-19 | Discharge: 2018-07-19 | Disposition: A | Discharge status: Home / Self Care | Payer: Medicaid Other | Attending: Family Medicine | Admitting: Family Medicine

## 2018-07-19 DIAGNOSIS — F17210 Nicotine dependence, cigarettes, uncomplicated: Secondary | ICD-10-CM | POA: Diagnosis not present

## 2018-07-19 DIAGNOSIS — E109 Type 1 diabetes mellitus without complications: Secondary | ICD-10-CM | POA: Diagnosis not present

## 2018-07-19 DIAGNOSIS — Z794 Long term (current) use of insulin: Secondary | ICD-10-CM | POA: Diagnosis not present

## 2018-07-19 DIAGNOSIS — K529 Noninfective gastroenteritis and colitis, unspecified: Secondary | ICD-10-CM | POA: Diagnosis not present

## 2018-07-19 DIAGNOSIS — E119 Type 2 diabetes mellitus without complications: Secondary | ICD-10-CM | POA: Diagnosis not present

## 2018-07-19 DIAGNOSIS — R110 Nausea: Secondary | ICD-10-CM | POA: Diagnosis present

## 2018-07-19 DIAGNOSIS — Z3202 Encounter for pregnancy test, result negative: Secondary | ICD-10-CM | POA: Diagnosis not present

## 2018-07-19 HISTORY — DX: Anxiety disorder, unspecified: F41.9

## 2018-07-19 LAB — CBC WITH DIFFERENTIAL/PLATELET
Abs Immature Granulocytes: 0.02 10*3/uL (ref 0.00–0.07)
BASOS ABS: 0 10*3/uL (ref 0.0–0.1)
Basophils Relative: 1 %
EOS ABS: 0.2 10*3/uL (ref 0.0–0.5)
EOS PCT: 3 %
HCT: 39.1 % (ref 36.0–46.0)
Hemoglobin: 14.2 g/dL (ref 12.0–15.0)
Immature Granulocytes: 0 %
Lymphocytes Relative: 34 %
Lymphs Abs: 3 10*3/uL (ref 0.7–4.0)
MCH: 30.7 pg (ref 26.0–34.0)
MCHC: 36.3 g/dL — AB (ref 30.0–36.0)
MCV: 84.6 fL (ref 80.0–100.0)
Monocytes Absolute: 0.5 10*3/uL (ref 0.1–1.0)
Monocytes Relative: 5 %
NRBC: 0 % (ref 0.0–0.2)
Neutro Abs: 5 10*3/uL (ref 1.7–7.7)
Neutrophils Relative %: 57 %
Platelets: 267 10*3/uL (ref 150–400)
RBC: 4.62 MIL/uL (ref 3.87–5.11)
RDW: 11 % — AB (ref 11.5–15.5)
WBC: 8.7 10*3/uL (ref 4.0–10.5)

## 2018-07-19 LAB — COMPREHENSIVE METABOLIC PANEL
ALBUMIN: 4.2 g/dL (ref 3.5–5.0)
ALT: 12 U/L (ref 0–44)
ANION GAP: 11 (ref 5–15)
AST: 18 U/L (ref 15–41)
Alkaline Phosphatase: 79 U/L (ref 38–126)
BUN: 10 mg/dL (ref 6–20)
CALCIUM: 9.3 mg/dL (ref 8.9–10.3)
CO2: 28 mmol/L (ref 22–32)
CREATININE: 0.7 mg/dL (ref 0.44–1.00)
Chloride: 101 mmol/L (ref 98–111)
GFR calc Af Amer: >60 mL/min (ref >60)
GFR calc non Af Amer: >60 mL/min (ref >60)
GLUCOSE: 164 mg/dL — AB (ref 70–99)
POTASSIUM: 4.2 mmol/L (ref 3.5–5.1)
SODIUM: 140 mmol/L (ref 135–145)
TOTAL PROTEIN: 7.6 g/dL (ref 6.5–8.1)
Total Bilirubin: 0.8 mg/dL (ref 0.3–1.2)

## 2018-07-19 LAB — URINALYSIS, COMPLETE (UACMP) WITH MICROSCOPIC
Glucose, UA: 500 mg/dL — AB
KETONES UR: 40 mg/dL — AB
Leukocytes, UA: NEGATIVE
Nitrite: NEGATIVE
PH: 6 (ref 5.0–8.0)
PROTEIN: NEGATIVE mg/dL
Specific Gravity, Urine: 1.025 (ref 1.005–1.030)

## 2018-07-19 LAB — PREGNANCY, URINE: PREG TEST UR: NEGATIVE

## 2018-07-19 MED ORDER — ONDANSETRON 8 MG PO TBDP
8.0000 mg | ORAL_TABLET | Freq: Once | ORAL | Status: AC
  Administered 2018-07-19: 8 mg via ORAL

## 2018-07-19 MED ORDER — ONDANSETRON 4 MG PO TBDP: 4.0000 mg | ORAL_TABLET | Freq: Three times a day (TID) | ORAL | 0 refills | Status: DC | PRN

## 2018-07-19 NOTE — ED Triage Notes (Signed)
Patient c/o abd pain, nausea, vomiting and diarrhea that started this morning. She has continued to feel nauseated throughout the day. She is also requesting a pregnancy test.

## 2018-07-19 NOTE — Discharge Instructions (Addendum)
Check sugar often.  Rest, fluids.  Zofran as needed.  Take care  Dr. Adriana Simas

## 2018-07-19 NOTE — ED Provider Notes (Signed)
[facility]    CSN: [id number] Arrival date & time: 07/19/18  1726  History   Chief Complaint Chief Complaint  Patient presents with  . Nausea  . Diarrhea   HPI  27 year old female with type 1 diabetes presents with nausea, vomiting, and diarrhea.  Started abruptly this morning.  She reports nausea, vomiting, diarrhea.  She is had several episodes of diarrhea as well as emesis.  She has taken some old Phenergan with some improvement.  No reported sick contacts.  No fever.  Symptoms are moderate in severity.  She reports associated abdominal pain.  No known exacerbating factors.  She states that her blood sugar has been slightly elevated today but her most recent one was 172 at approximately 5:25 PM.  No other reported symptoms.  No other complaints.  Social hx reviewed as below.  Social History Social History   Tobacco Use  . Smoking status: Current Every Day Smoker    Packs/day: 0.50  . Smokeless tobacco: Never Used  Substance Use Topics  . Alcohol use: No  . Drug use: No   Allergies   Patient has no known allergies.  Review of Systems Review of Systems  Constitutional: Positive for appetite change. Negative for fever.  Gastrointestinal: Positive for abdominal pain, diarrhea, nausea and vomiting.   Physical Exam Triage Vital Signs ED Triage Vitals  Enc Vitals Group     BP 07/19/18 1733 111/87     Pulse Rate 07/19/18 1733 88     Resp 07/19/18 1733 18     Temp 07/19/18 1733 98.1 F (36.7 C)     Temp Source 07/19/18 1733 Oral     SpO2 07/19/18 1733 100 %     Weight 07/19/18 1734 148 lb (67.1 kg)     Height 07/19/18 1734 5\' 1"  (1.549 m)     Head Circumference --      Peak Flow --      Pain Score 07/19/18 1733 8     Pain Loc --      Pain Edu? --      Excl. in GC? --    Updated Vital Signs BP 111/87 (BP Location: Left Arm)   Pulse 88   Temp 98.1 F (36.7 C) (Oral)   Resp 18   Ht 5\' 1"  (1.549 m)   Wt 67.1 kg   SpO2 100%   BMI 27.96 kg/m    Visual Acuity Right Eye Distance:   Left Eye Distance:   Bilateral Distance:    Right Eye Near:   Left Eye Near:    Bilateral Near:     Physical Exam  Constitutional: She is oriented to person, place, and time. She appears well-developed. No distress.  HENT:  Head: Normocephalic and atraumatic.  Cardiovascular: Normal rate and regular rhythm.  Pulmonary/Chest: Effort normal and breath sounds normal. She has no wheezes. She has no rales.  Abdominal: Soft. She exhibits no distension.  Mild tenderness in the RUQ and epigastric region.  Neurological: She is alert and oriented to person, place, and time.  Psychiatric: She has a normal mood and affect. Her behavior is normal.  Nursing note and vitals reviewed.  UC Treatments / Results  Labs (all labs ordered are listed, but only abnormal results are displayed) Labs Reviewed  URINALYSIS, COMPLETE (UACMP) WITH MICROSCOPIC - Abnormal; Notable for the following components:      Result Value   APPearance HAZY (*)    Glucose, UA 500 (*)    Hgb urine dipstick  TRACE (*)    Bilirubin Urine SMALL (*)    Ketones, ur 40 (*)    Bacteria, UA FEW (*)    All other components within normal limits  CBC WITH DIFFERENTIAL/PLATELET - Abnormal; Notable for the following components:   MCHC 36.3 (*)    RDW 11.0 (*)    All other components within normal limits  COMPREHENSIVE METABOLIC PANEL - Abnormal; Notable for the following components:   Glucose, Bld 164 (*)    All other components within normal limits  PREGNANCY, URINE    EKG None  Radiology No results found.  Procedures Procedures (including critical care time)  Medications Ordered in UC Medications  ondansetron (ZOFRAN-ODT) disintegrating tablet 8 mg (8 mg Oral Given 07/19/18 1752)    Initial Impression / Assessment and Plan / UC Course  I have reviewed the triage vital signs and the nursing notes.  Pertinent labs & imaging results that were available during my care of the  patient were reviewed by me and considered in my medical decision making (see chart for details).    27 year old female presents with viral gastroenteritis.  Patient given Zofran here and was tolerating fluids.  Labs stable.  Advised frequent checks of her blood sugar.  Push fluids.  Zofran as needed.  Supportive care.  Final Clinical Impressions(s) / UC Diagnoses   Final diagnoses:  Gastroenteritis     Discharge Instructions     Check sugar often.  Rest, fluids.  Zofran as needed.  Take care  Dr. Adriana Simas     ED Prescriptions    Medication Sig Dispense Auth. Provider   ondansetron (ZOFRAN-ODT) 4 MG disintegrating tablet Take 1 tablet (4 mg total) by mouth every 8 (eight) hours as needed for nausea or vomiting. 20 tablet Tommie Sams, DO     Controlled Substance Prescriptions [place] Controlled Substance Registry consulted? Not Applicable   Tommie Sams, DO 07/19/18 1840

## 2018-10-16 ENCOUNTER — Encounter: Payer: Self-pay | Admitting: Gynecology

## 2018-10-16 ENCOUNTER — Ambulatory Visit
Admission: EM | Admit: 2018-10-16 | Discharge: 2018-10-16 | Disposition: A | Discharge status: Home / Self Care | Payer: Medicaid Other | Attending: Emergency Medicine | Admitting: Emergency Medicine

## 2018-10-16 ENCOUNTER — Other Ambulatory Visit: Payer: Self-pay

## 2018-10-16 DIAGNOSIS — N946 Dysmenorrhea, unspecified: Secondary | ICD-10-CM

## 2018-10-16 DIAGNOSIS — Z113 Encounter for screening for infections with a predominantly sexual mode of transmission: Secondary | ICD-10-CM | POA: Diagnosis present

## 2018-10-16 DIAGNOSIS — B9689 Other specified bacterial agents as the cause of diseases classified elsewhere: Secondary | ICD-10-CM | POA: Diagnosis present

## 2018-10-16 DIAGNOSIS — J029 Acute pharyngitis, unspecified: Secondary | ICD-10-CM | POA: Diagnosis present

## 2018-10-16 DIAGNOSIS — N760 Acute vaginitis: Secondary | ICD-10-CM

## 2018-10-16 LAB — CHLAMYDIA/NGC RT PCR (ARMC ONLY)
Chlamydia Tr: NOT DETECTED
N GONORRHOEAE: NOT DETECTED

## 2018-10-16 LAB — WET PREP, GENITAL
Sperm: NONE SEEN
Trich, Wet Prep: NONE SEEN
Yeast Wet Prep HPF POC: NONE SEEN

## 2018-10-16 LAB — RAPID STREP SCREEN (MED CTR MEBANE ONLY): Streptococcus, Group A Screen (Direct): NEGATIVE

## 2018-10-16 MED ORDER — KETOROLAC TROMETHAMINE 10 MG PO TABS: 10.0000 mg | ORAL_TABLET | Freq: Four times a day (QID) | ORAL | 0 refills | Status: AC

## 2018-10-16 MED ORDER — METRONIDAZOLE 500 MG PO TABS: 500.0000 mg | ORAL_TABLET | Freq: Two times a day (BID) | ORAL | 0 refills | Status: AC

## 2018-10-16 MED ORDER — KETOROLAC TROMETHAMINE 60 MG/2ML IM SOLN
60.0000 mg | Freq: Once | INTRAMUSCULAR | Status: AC
  Administered 2018-10-16: 60 mg via INTRAMUSCULAR

## 2018-10-16 MED ORDER — LIDOCAINE VISCOUS HCL 2 % MT SOLN: 15.0000 mL | OROMUCOSAL | 0 refills | Status: AC | PRN

## 2018-10-16 NOTE — Discharge Instructions (Signed)
MENSTRUAL CRAMPS AND HEAVY BLEEDING: Talk to your PCP or gynecologist about having the nexplanon removed as it may be causing the heavy bleeding and cramps to be worse. You have been given an injection of ketorolac in the clinic for cramps and I have written a prescription for this medication to take as needed for pain relief. Use heating pad as well.   BV INFECTION: Begin metronidazole for this infection. See handout. We have sent out labs to test for STIs and will call with results in a few days. Remember to practice safe sex.   SORE THROAT: The treatment of sore throat depends upon the cause; strep throat is treated with an antibiotic, while viral pharyngitis is treated with rest, pain relievers. If prescribed, finish the entire course of antibiotics .Increase rest, fluids, and OTC meds as needed . If you have any questions or concerns, please call us or stop back at any time and we will be happy to help you. If your symptoms worsen, f/u with our office or go to the ER

## 2018-10-16 NOTE — ED Triage Notes (Addendum)
Patient c/o menstrual cramps with the nexplanon in her arm/ swollen lymph node causing her to have earache. Per patient painful to swallow. Patient would like to be tested for STD also.

## 2018-10-16 NOTE — ED Provider Notes (Signed)
[facility]    CSN: [id number] Arrival date & time: 10/16/18  1551     History   Chief Complaint Chief Complaint  Patient presents with  . Sore Throat  . menstrual cramp  . SEXUALLY TRANSMITTED DISEASE    HPI [place] Nelson is a 28 y.o. female. Patient presents for multiple complaints. She admits to heavy vaginal bleeding and severe menstrual cramping since having Nexplanon placed 2 years ago. Menses began about 2 days ago. She says they usually last 3 days, but she has a lot of pain that Midol does not help. She would like STI testing, but is not concerned. Denies any abnormal discharge or vaginal odor.   Additionally she is complaining of a sore throat x 2 days. Admits to nasal congestion. Denies fever. She has taken Catering managerAlka Seltzer with some relief. She has no other concerns today.  HPI  Past Medical History:  Diagnosis Date  . Anxiety   . Type 1 diabetes (HCC)     There are no active problems to display for this patient.   Past Surgical History:  Procedure Laterality Date  . INDUCED ABORTION    . LAPAROSCOPY    . NO PAST SURGERIES      OB History   No obstetric history on file.      Home Medications    Prior to Admission medications   Medication Sig Start Date End Date Taking? Authorizing Provider  clonazePAM (KLONOPIN) 1 MG tablet Take 1 mg by mouth 2 (two) times daily.   Yes [provider]  escitalopram (LEXAPRO) 20 MG tablet TK 1 T PO D 03/01/18  Yes [provider]  Etonogestrel (NEXPLANON [place]) Inject into the skin.   Yes [provider]  insulin aspart (NOVOLOG) 100 UNIT/ML injection Inject into the skin 3 (three) times daily before meals.   Yes [provider]  insulin glargine (LANTUS) 100 UNIT/ML injection Inject 30 Units into the skin at bedtime.   Yes [provider]  ondansetron (ZOFRAN-ODT) 4 MG disintegrating tablet Take 1 tablet (4 mg total) by mouth every 8 (eight) hours as needed for  nausea or vomiting. 07/19/18  Yes Cook, Jayce G, DO  ketorolac (TORADOL) 10 MG tablet Take 1 tablet (10 mg total) by mouth every 6 (six) hours for 5 days. 10/16/18 10/21/18  Eusebio FriendlyEaves, Lesley B, PA-C  lidocaine (XYLOCAINE) 2 % solution Use as directed 15 mLs in the mouth or throat every 3 (three) hours as needed for up to 3 days for mouth pain. 10/16/18 10/19/18  Shirlee LatchEaves, Lesley B, PA-C  methadone (METHADOSE) 40 MG disintegrating tablet Take by mouth.    [provider]  metroNIDAZOLE (FLAGYL) 500 MG tablet Take 1 tablet (500 mg total) by mouth 2 (two) times daily for 7 days. 10/16/18 10/23/18  Shirlee LatchEaves, Lesley B, PA-C    Family History History reviewed. No pertinent family history.  Social History Social History   Tobacco Use  . Smoking status: Current Every Day Smoker    Packs/day: 0.50  . Smokeless tobacco: Never Used  Substance Use Topics  . Alcohol use: No  . Drug use: No     Allergies   Patient has no known allergies.   Review of Systems Review of Systems  Constitutional: Negative for fatigue and fever.  HENT: Positive for congestion, rhinorrhea and sore throat.   Respiratory: Negative for cough and shortness of breath.   Cardiovascular: Negative for chest pain.  Gastrointestinal: Positive for abdominal pain (pelvic cramping).  Negative for nausea and vomiting.  Genitourinary: Positive for menstrual problem, pelvic pain and vaginal bleeding. Negative for dysuria, frequency, vaginal discharge and vaginal pain.  Musculoskeletal: Negative for arthralgias and myalgias.  Skin: Negative for color change and rash.  Neurological: Negative for dizziness and headaches.  Hematological: Negative for adenopathy.     Physical Exam Triage Vital Signs ED Triage Vitals  Enc Vitals Group     BP 10/16/18 1613 104/70     Pulse Rate 10/16/18 1613 69     Resp 10/16/18 1613 16     Temp 10/16/18 1613 98.3 F (36.8 C)     Temp Source 10/16/18 1613 Oral     SpO2 10/16/18 1613 98 %      Weight 10/16/18 1614 140 lb (63.5 kg)     Height 10/16/18 1614 5\' 1"  (1.549 m)     Head Circumference --      Peak Flow --      Pain Score 10/16/18 1614 8     Pain Loc --      Pain Edu? --      Excl. in GC? --    No data found.  Updated Vital Signs BP 104/70 (BP Location: Left Arm)   Pulse 69   Temp 98.3 F (36.8 C) (Oral)   Resp 16   Ht 5\' 1"  (1.549 m)   Wt 140 lb (63.5 kg)   LMP 09/15/2018   SpO2 98%   BMI 26.45 kg/m      Physical Exam Vitals signs and nursing note reviewed.  Constitutional:      General: She is not in acute distress.    Appearance: She is well-developed and normal weight. She is not toxic-appearing.  HENT:     Head: Normocephalic and atraumatic.     Right Ear: Tympanic membrane and ear canal normal. Tympanic membrane is not erythematous.     Left Ear: Tympanic membrane and ear canal normal. Tympanic membrane is not erythematous.     Mouth/Throat:     Mouth: Mucous membranes are moist.     Pharynx: Posterior oropharyngeal erythema present. No oropharyngeal exudate.     Tonsils: No tonsillar exudate. Swelling: 1+ on the right. 1+ on the left.  Eyes:     Conjunctiva/sclera: Conjunctivae normal.  Neck:     Musculoskeletal: Neck supple.  Cardiovascular:     Rate and Rhythm: Normal rate and regular rhythm.     Heart sounds: Normal heart sounds. No murmur.  Pulmonary:     Effort: Pulmonary effort is normal. No respiratory distress.     Breath sounds: Normal breath sounds. No wheezing, rhonchi or rales.  Abdominal:     General: Bowel sounds are normal. There is no distension.     Palpations: Abdomen is soft.     Tenderness: There is no abdominal tenderness. There is no guarding or rebound.  Lymphadenopathy:     Cervical: No cervical adenopathy.  Skin:    General: Skin is warm and dry.     Findings: No erythema or rash.  Neurological:     General: No focal deficit present.     Mental Status: She is alert and oriented to person, place, and time.    Psychiatric:        Mood and Affect: Mood normal.        Behavior: Behavior normal.      UC Treatments / Results  Labs (all labs ordered are listed, but only abnormal results are displayed) Labs Reviewed  WET  PREP, GENITAL - Abnormal; Notable for the following components:      Result Value   Clue Cells Wet Prep HPF POC PRESENT (*)    WBC, Wet Prep HPF POC FEW (*)    All other components within normal limits  RAPID STREP SCREEN ([facility] ONLY)  CULTURE, GROUP A STREP ([facility])  CHLAMYDIA/NGC RT PCR ([facility] ONLY)    EKG None  Radiology No results found.  Procedures Procedures (including critical care time)  Medications Ordered in UC Medications  ketorolac (TORADOL) injection 60 mg (60 mg Intramuscular Given 10/16/18 1722)    Initial Impression / Assessment and Plan / UC Course  I have reviewed the triage vital signs and the nursing notes.  Pertinent labs & imaging results that were available during my care of the patient were reviewed by me and considered in my medical decision making (see chart for details).    Final Clinical Impressions(s) / UC Diagnoses   Final diagnoses:  Menstrual cramps  Bacterial vaginosis  Sore throat  Screening examination for sexually transmitted disease     Discharge Instructions     MENSTRUAL CRAMPS AND HEAVY BLEEDING: Talk to your PCP or gynecologist about having the nexplanon removed as it may be causing the heavy bleeding and cramps to be worse. You have been given an injection of ketorolac in the clinic for cramps and I have written a prescription for this medication to take as needed for pain relief. Use heating pad as well.   BV INFECTION: Begin metronidazole for this infection. See handout. We have sent out labs to test for STIs and will call with results in a few days. Remember to practice safe sex.   SORE THROAT: The treatment of sore throat depends upon the cause; strep throat is treated with an antibiotic,  while viral pharyngitis is treated with rest, pain relievers. If prescribed, finish the entire course of antibiotics .Increase rest, fluids, and OTC meds as needed . If you have any questions or concerns, please call us or stop back at any time and we will be happy to help you. If your symptoms worsen, f/u with our office or go to the ER    ED Prescriptions    Medication Sig Dispense Auth. Provider   ketorolac (TORADOL) 10 MG tablet Take 1 tablet (10 mg total) by mouth every 6 (six) hours for 5 days. 20 tablet Eusebio Friendly B, PA-C   metroNIDAZOLE (FLAGYL) 500 MG tablet Take 1 tablet (500 mg total) by mouth 2 (two) times daily for 7 days. 14 tablet Eusebio Friendly B, PA-C   lidocaine (XYLOCAINE) 2 % solution Use as directed 15 mLs in the mouth or throat every 3 (three) hours as needed for up to 3 days for mouth pain. 150 mL Eusebio Friendly B, PA-C     Controlled Substance Prescriptions [place] Controlled Substance Registry consulted? Not Applicable   Gareth Morgan 10/16/18 1736

## 2018-10-19 ENCOUNTER — Telehealth (HOSPITAL_COMMUNITY): Payer: Self-pay | Admitting: Emergency Medicine

## 2018-10-19 LAB — CULTURE, GROUP A STREP (THRC)

## 2018-10-19 NOTE — Telephone Encounter (Signed)
Culture is positive for non group A Strep germ.  This is a finding of uncertain significance; not the typical 'strep throat' germ.  Pt complains of persistent symptoms.  Rx for penicillin V 500mg  bid x 10d #20 no refills sent to pharmacy of patients choice. Pt verbalized understanding. Recheck for further evaluation if symptoms are not improving  No answer LVMM

## 2019-01-10 ENCOUNTER — Other Ambulatory Visit: Payer: Self-pay

## 2019-01-10 ENCOUNTER — Ambulatory Visit
Admission: EM | Admit: 2019-01-10 | Discharge: 2019-01-10 | Disposition: A | Discharge status: Home / Self Care | Payer: Medicaid Other | Attending: Family Medicine | Admitting: Family Medicine

## 2019-01-10 DIAGNOSIS — M79642 Pain in left hand: Secondary | ICD-10-CM

## 2019-01-10 MED ORDER — DICLOFENAC SODIUM 1 % TD GEL: 2.0000 g | Freq: Four times a day (QID) | TRANSDERMAL | 1 refills | Status: DC

## 2019-01-10 NOTE — ED Provider Notes (Addendum)
[facility]    CSN: [id number] Arrival date & time: 01/10/19  1408  History   Chief Complaint Chief Complaint  Patient presents with  . Hand Pain    left   HPI  28 year old female presents with the above complaint.  Patient has a history of carpal tunnel.  Patient states that over the past 3 months experiencing worsening left hand pain.  Described as burning/tingling.  Severe.  Affects the wrist as well.  Worse at night.  Currently 10/10 in severity.  She has been placed on gabapentin and is awaiting a nerve conduction study.  Patient is currently on methadone as well.  Patient states she is currently taking 2 600 mg tablets of gabapentin 4-5 times a day.  She is exceeding the maximum daily dosage.  No known relieving factors.  No other associated symptoms.  No other complaints.  History reviewed and updated as below.  Past Medical History:  Diagnosis Date  . Anxiety   . Type 1 diabetes (HCC)   Carpal tunnel  Past Surgical History:  Procedure Laterality Date  . INDUCED ABORTION    . LAPAROSCOPY     OB History   No obstetric history on file.    Home Medications    Prior to Admission medications   Medication Sig Start Date End Date Taking? Authorizing Provider  busPIRone (BUSPAR) 10 MG tablet TK 1 T PO BID 12/23/18  Yes [provider]  clonazePAM (KLONOPIN) 1 MG tablet Take 1 mg by mouth 2 (two) times daily.   Yes [provider]  doxepin (SINEQUAN) 10 MG capsule TK 1 C PO HS 12/23/18  Yes [provider]  escitalopram (LEXAPRO) 20 MG tablet TK 1 T PO D 03/01/18  Yes [provider]  Etonogestrel (NEXPLANON [place]) Inject into the skin.   Yes [provider]  gabapentin (NEURONTIN) 600 MG tablet  01/02/19  Yes [provider]  insulin aspart (NOVOLOG) 100 UNIT/ML injection Inject into the skin 3 (three) times daily before meals.   Yes [provider]  insulin glargine (LANTUS) 100 UNIT/ML injection Inject  30 Units into the skin at bedtime.   Yes [provider]  levothyroxine (SYNTHROID, LEVOTHROID) 112 MCG tablet TK 1 T PO D. DOSE ADJUSTMENT BASED ON LATEST LABS. 11/24/18  Yes [provider]  methadone (METHADOSE) 40 MG disintegrating tablet Take by mouth.   Yes [provider]  diclofenac sodium (VOLTAREN) 1 % GEL Apply 2 g topically 4 (four) times daily. 01/10/19   Tommie Samsook, Neyra Pettie G, DO   Social History Social History   Tobacco Use  . Smoking status: Current Every Day Smoker    Packs/day: 0.50  . Smokeless tobacco: Never Used  Substance Use Topics  . Alcohol use: No  . Drug use: No     Allergies   Patient has no known allergies.   Review of Systems Review of Systems  Constitutional: Negative.   Musculoskeletal:       Hand/wrist pain.   Physical Exam Triage Vital Signs ED Triage Vitals  Enc Vitals Group     BP 01/10/19 1428 114/82     Pulse Rate 01/10/19 1428 96     Resp 01/10/19 1428 16     Temp 01/10/19 1428 98.5 F (36.9 C)     Temp Source 01/10/19 1428 Oral     SpO2 01/10/19 1428 98 %     Weight 01/10/19 1426 150 lb (68 kg)     Height 01/10/19 1426  5\' 1"  (1.549 m)     Head Circumference --      Peak Flow --      Pain Score 01/10/19 1426 10     Pain Loc --      Pain Edu? --      Excl. in GC? --    Updated Vital Signs BP 114/82 (BP Location: Right Arm)   Pulse 96   Temp 98.5 F (36.9 C) (Oral)   Resp 16   Ht 5\' 1"  (1.549 m)   Wt 68 kg   SpO2 98%   BMI 28.34 kg/m   Visual Acuity Right Eye Distance:   Left Eye Distance:   Bilateral Distance:    Right Eye Near:   Left Eye Near:    Bilateral Near:     Physical Exam Vitals signs and nursing note reviewed.  Constitutional:      General: She is not in acute distress.    Appearance: Normal appearance.  HENT:     Head: Normocephalic and atraumatic.  Eyes:     General:        Right eye: No discharge.        Left eye: No discharge.     Conjunctiva/sclera: Conjunctivae  normal.  Pulmonary:     Effort: Pulmonary effort is normal. No respiratory distress.  Musculoskeletal:     Comments: No discrete areas of tenderness of the left hand and wrist.  Skin:    General: Skin is warm.     Findings: No bruising or rash.  Neurological:     Mental Status: She is alert.  Psychiatric:        Mood and Affect: Mood normal.        Behavior: Behavior normal.    UC Treatments / Results  Labs (all labs ordered are listed, but only abnormal results are displayed) Labs Reviewed - No data to display  EKG None  Radiology No results found.  Procedures Procedures (including critical care time)  Medications Ordered in UC Medications - No data to display  Initial Impression / Assessment and Plan / UC Course  I have reviewed the triage vital signs and the nursing notes.  Pertinent labs & imaging results that were available during my care of the patient were reviewed by me and considered in my medical decision making (see chart for details).    28 year old female presents with left hand and wrist pain.  Awaiting nerve conduction study.  I advised her to not exceed the maximum daily dose of gabapentin.  Topical Voltaren as prescribed.  Final Clinical Impressions(s) / UC Diagnoses   Final diagnoses:  Hand pain, left     Discharge Instructions     Use the medication as directed.  Max dose of gabapentin is 3600 mg/day.  Follow up with your PCP.  Take care  Dr. Adriana Simas    ED Prescriptions    Medication Sig Dispense Auth. Provider   diclofenac sodium (VOLTAREN) 1 % GEL Apply 2 g topically 4 (four) times daily. 100 g Tommie Sams, DO     Controlled Substance Prescriptions [place] Controlled Substance Registry consulted? Not Applicable   Tommie Sams, DO 01/10/19 1536    Everlene Other G, DO 03/10/19 1737

## 2019-01-10 NOTE — Discharge Instructions (Signed)
Use the medication as directed.  Max dose of gabapentin is 3600 mg/day.  Follow up with your PCP.  Take care  Dr. Adriana Simas

## 2019-01-10 NOTE — ED Triage Notes (Signed)
Patient complains of left hand pain that she is unsure if she has carpal tunnel or diabetic neuropathy. Patient states that she has tried braces but they stop working. Patient reports that she had an E-visit with her primary doctor last week and recommended her to have nerve conduction. Patient states that she thinks she may need to see a Careers adviser. Patient states that the pain is worse at night. States that she has also been using hemp gloves. Patient states she had her gabapentin increased twice.

## 2019-03-10 ENCOUNTER — Ambulatory Visit
Admission: EM | Admit: 2019-03-10 | Discharge: 2019-03-10 | Disposition: A | Discharge status: Home / Self Care | Payer: Medicaid Other | Attending: Family Medicine | Admitting: Family Medicine

## 2019-03-10 ENCOUNTER — Other Ambulatory Visit: Payer: Self-pay

## 2019-03-10 ENCOUNTER — Encounter: Payer: Self-pay | Admitting: Emergency Medicine

## 2019-03-10 DIAGNOSIS — M79642 Pain in left hand: Secondary | ICD-10-CM | POA: Diagnosis not present

## 2019-03-10 MED ORDER — PREDNISONE 10 MG (21) PO TBPK: ORAL_TABLET | ORAL | 0 refills | Status: DC

## 2019-03-10 NOTE — Discharge Instructions (Signed)
Prednisone as prescribed.  This is likely to raise your sugar.  See the surgeon.  Take care  Dr. Adriana Simas

## 2019-03-10 NOTE — ED Triage Notes (Signed)
Pt c/o left wrist pain. She has known carpel tunnel and was scheduled for surgery but had to be postponed due to COVID. She is scheduled to see her surgeon next week. She is having a hard time sleeping due to the pain.

## 2019-03-12 NOTE — ED Provider Notes (Signed)
[facility]    CSN: [id number] Arrival date & time: 03/10/19  1729     History   Chief Complaint Chief Complaint  Patient presents with  . Wrist Pain    left   HPI   28 year old female presents with continued complaints of left hand pain and wrist pain.  Patient has been clinically diagnosed with carpal tunnel syndrome.  She has been seen in the ER and at urgent care multiple times for this.  She has yet to see an orthopedist or surgeon.  She has yet to have nerve conduction study.  Patient continues to complain of left hand and wrist pain.  Severe.  She is had no relief with NSAIDs, bracing.  She has had some improvement with gabapentin but no resolution.  Patient states that she is having difficulty working.  She would like a note for work today.  Patient also requesting something else to aid in her pain.  She states that it keeps her up at night and makes it difficult to sleep.  Other associated symptoms.  No other complaints.  PMH, Surgical Hx, Family Hx, Social History reviewed and updated as below.  Past Medical History:  Diagnosis Date  . Anxiety   . Type 1 diabetes ([place])   Depression Drug abuse  Past Surgical History:  Procedure Laterality Date  . INDUCED ABORTION    . LAPAROSCOPY     OB History   No obstetric history on file.    Home Medications    Prior to Admission medications   Medication Sig Start Date End Date Taking? Authorizing Provider  busPIRone (BUSPAR) 10 MG tablet TK 1 T PO BID 12/23/18  Yes [provider]  clonazePAM (KLONOPIN) 1 MG tablet Take 1 mg by mouth 2 (two) times daily.   Yes [provider]  doxepin (SINEQUAN) 10 MG capsule TK 1 C PO HS 12/23/18  Yes [provider]  escitalopram (LEXAPRO) 20 MG tablet TK 1 T PO D 03/01/18  Yes [provider]  Etonogestrel (NEXPLANON [place]) Inject into the skin.   Yes [provider]  gabapentin (NEURONTIN) 600 MG tablet  01/02/19  Yes [provider]  insulin aspart (NOVOLOG) 100 UNIT/ML injection Inject into the skin 3 (three) times daily before meals.   Yes [provider]  insulin glargine (LANTUS) 100 UNIT/ML injection Inject 30 Units into the skin at bedtime.   Yes [provider]  levothyroxine (SYNTHROID, LEVOTHROID) 112 MCG tablet TK 1 T PO D. DOSE ADJUSTMENT BASED ON LATEST LABS. 11/24/18  Yes [provider]  methadone (METHADOSE) 40 MG disintegrating tablet Take by mouth.   Yes [provider]  predniSONE (STERAPRED UNI-PAK 21 TAB) 10 MG (21) TBPK tablet 6 tablets on day 1; decrease by 1 tablet daily until gone. 03/10/19   Coral Spikes, DO    Family History Family History  Problem Relation Age of Onset  . Cirrhosis Mother   . Hypertension Mother     Social History Social History   Tobacco Use  . Smoking status: Current Every Day Smoker    Packs/day: 0.50  . Smokeless tobacco: Never Used  Substance Use Topics  . Alcohol use: No  . Drug use: No     Allergies   Patient has no known allergies.   Review of Systems Review of Systems  Constitutional: Negative.   Musculoskeletal:       Left hand pain and wrist pain.  Psychiatric/Behavioral: Positive for sleep disturbance.  Physical Exam Triage Vital Signs ED Triage Vitals  Enc Vitals Group     BP 03/10/19 1743 115/81     Pulse Rate 03/10/19 1743 86     Resp 03/10/19 1743 18     Temp 03/10/19 1743 98.1 F (36.7 C)     Temp Source 03/10/19 1743 Oral     SpO2 03/10/19 1743 98 %     Weight 03/10/19 1739 158 lb (71.7 kg)     Height 03/10/19 1739 5\' 1"  (1.549 m)     Head Circumference --      Peak Flow --      Pain Score 03/10/19 1739 8     Pain Loc --      Pain Edu? --      Excl. in GC? --    Updated Vital Signs BP 115/81 (BP Location: Left Arm)   Pulse 86   Temp 98.1 F (36.7 C) (Oral)   Resp 18   Ht 5\' 1"  (1.549 m)   Wt 71.7 kg   SpO2 98%   BMI 29.85 kg/m   Visual Acuity Right Eye Distance:    Left Eye Distance:   Bilateral Distance:    Right Eye Near:   Left Eye Near:    Bilateral Near:     Physical Exam Vitals signs and nursing note reviewed.  Constitutional:      General: She is not in acute distress.    Appearance: Normal appearance.  HENT:     Head: Normocephalic and atraumatic.  Eyes:     General:        Right eye: No discharge.     Conjunctiva/sclera: Conjunctivae normal.  Pulmonary:     Effort: Pulmonary effort is normal. No respiratory distress.  Musculoskeletal:     Comments: No discrete areas of tenderness of the left hand or left wrist.  Normal range of motion.  Skin:    General: Skin is warm.     Findings: No rash.  Neurological:     Mental Status: She is alert.  Psychiatric:        Behavior: Behavior normal.     Comments: Flat affect.    UC Treatments / Results  Labs (all labs ordered are listed, but only abnormal results are displayed) Labs Reviewed - No data to display  EKG None  Radiology No results found.  Procedures Procedures (including critical care time)  Medications Ordered in UC Medications - No data to display  Initial Impression / Assessment and Plan / UC Course  I have reviewed the triage vital signs and the nursing notes.  Pertinent labs & imaging results that were available during my care of the patient were reviewed by me and considered in my medical decision making (see chart for details).    28 year old female presents with continued hand pain and wrist pain which is thought to be secondary to carpal tunnel.  Patient states that she is not sure what to do since she continues to have severe pain.  Patient would like to try a trial of corticosteroids.  I informed her that this may or may not help.  I also advised her that this will raise her blood sugar.  Brief course of prednisone.  Advised to see orthopedics.  Final Clinical Impressions(s) / UC Diagnoses   Final diagnoses:  Pain of left hand     Discharge  Instructions     Prednisone as prescribed.  This is likely to raise your sugar.  See the surgeon.  Take care  Dr. Adriana Simasook    ED Prescriptions    Medication Sig Dispense Auth. Provider   predniSONE (STERAPRED UNI-PAK 21 TAB) 10 MG (21) TBPK tablet 6 tablets on day 1; decrease by 1 tablet daily until gone. 21 tablet Tommie Samsook, Amia Rynders G, DO     Controlled Substance Prescriptions [place] Controlled Substance Registry consulted? Not Applicable   Tommie SamsCook, Ayleah Hofmeister G, DO 03/12/19 1036

## 2019-03-13 ENCOUNTER — Other Ambulatory Visit: Payer: Self-pay | Admitting: Family Medicine

## 2019-04-01 ENCOUNTER — Encounter: Payer: Self-pay | Admitting: Emergency Medicine

## 2019-04-01 ENCOUNTER — Ambulatory Visit
Admission: EM | Admit: 2019-04-01 | Discharge: 2019-04-01 | Disposition: A | Discharge status: Home / Self Care | Payer: Medicaid Other | Attending: Emergency Medicine | Admitting: Emergency Medicine

## 2019-04-01 ENCOUNTER — Other Ambulatory Visit: Payer: Self-pay

## 2019-04-01 DIAGNOSIS — K3184 Gastroparesis: Secondary | ICD-10-CM | POA: Diagnosis present

## 2019-04-01 DIAGNOSIS — Z3202 Encounter for pregnancy test, result negative: Secondary | ICD-10-CM

## 2019-04-01 DIAGNOSIS — R110 Nausea: Secondary | ICD-10-CM | POA: Diagnosis not present

## 2019-04-01 LAB — PREGNANCY, URINE: Preg Test, Ur: NEGATIVE

## 2019-04-01 MED ORDER — ONDANSETRON HCL 4 MG PO TABS: 4.0000 mg | ORAL_TABLET | Freq: Three times a day (TID) | ORAL | 0 refills | Status: AC | PRN

## 2019-04-01 MED ORDER — METOCLOPRAMIDE HCL 10 MG PO TABS: 10.0000 mg | ORAL_TABLET | Freq: Four times a day (QID) | ORAL | 0 refills | Status: DC

## 2019-04-01 NOTE — ED Triage Notes (Signed)
Patient c/o vomiting yesterday and has since resolved.  Patient report loose stools that started this morning.  Patient reports some stomach and gas pain.  Patient denies fevers.

## 2019-04-01 NOTE — ED Provider Notes (Signed)
[facility]    CSN: [id number] Arrival date & time: 04/01/19  1528     History   Chief Complaint Chief Complaint  Patient presents with  . Diarrhea    HPI Misty Nelson is a 28 y.o. female presenting with N/V/D for less than 48 hrs. Pt vomited once yesterday. She then noticed that her blood sugar "was low". Loose stools started today. Pt states she has had symptoms like this before and was diagnosed with gastroparesis. Pt denies fever, chills, body aches. No recent changes to diet. No recollection of eating anything out of the ordinary.   Past Medical History:  Diagnosis Date  . Anxiety   . Type 1 diabetes ([place])     There are no active problems to display for this patient.   Past Surgical History:  Procedure Laterality Date  . INDUCED ABORTION    . LAPAROSCOPY    . NO PAST SURGERIES      OB History   No obstetric history on file.      Home Medications    Prior to Admission medications   Medication Sig Start Date End Date Taking? Authorizing Provider  busPIRone (BUSPAR) 10 MG tablet TK 1 T PO BID 12/23/18  Yes [provider]  clonazePAM (KLONOPIN) 1 MG tablet Take 1 mg by mouth 2 (two) times daily.   Yes [provider]  doxepin (SINEQUAN) 10 MG capsule TK 1 C PO HS 12/23/18  Yes [provider]  escitalopram (LEXAPRO) 20 MG tablet TK 1 T PO D 03/01/18  Yes [provider]  Etonogestrel (NEXPLANON [place]) Inject into the skin.   Yes [provider]  gabapentin (NEURONTIN) 600 MG tablet  01/02/19  Yes [provider]  insulin aspart (NOVOLOG) 100 UNIT/ML injection Inject into the skin 3 (three) times daily before meals.   Yes [provider]  insulin glargine (LANTUS) 100 UNIT/ML injection Inject 30 Units into the skin at bedtime.   Yes [provider]  methadone (METHADOSE) 40 MG disintegrating tablet Take by mouth.   Yes [provider]  levothyroxine (SYNTHROID, LEVOTHROID)  112 MCG tablet TK 1 T PO D. DOSE ADJUSTMENT BASED ON LATEST LABS. 11/24/18   [provider]  metoCLOPramide (REGLAN) 10 MG tablet Take 1 tablet (10 mg total) by mouth every 6 (six) hours for 5 days. 04/01/19 04/06/19  Gertie Baron, NP  ondansetron (ZOFRAN) 4 MG tablet Take 1 tablet (4 mg total) by mouth every 8 (eight) hours as needed for up to 7 days for nausea or vomiting. 04/01/19 04/08/19  Gertie Baron, NP  predniSONE (STERAPRED UNI-PAK 21 TAB) 10 MG (21) TBPK tablet 6 tablets on day 1; decrease by 1 tablet daily until gone. 03/10/19   Coral Spikes, DO    Family History Family History  Problem Relation Age of Onset  . Cirrhosis Mother   . Hypertension Mother     Social History Social History   Tobacco Use  . Smoking status: Current Every Day Smoker    Packs/day: 0.50    Types: Cigarettes  . Smokeless tobacco: Never Used  Substance Use Topics  . Alcohol use: No  . Drug use: No     Allergies   Patient has no known allergies.   Review of Systems Review of Systems  Constitutional: Positive for appetite change. Negative for chills, diaphoresis, fatigue and fever.  Gastrointestinal: Positive for abdominal pain, diarrhea, nausea and vomiting. Negative for blood in stool.  Endocrine: Negative for  polyphagia and polyuria.  Skin: Negative for pallor.     Physical Exam Triage Vital Signs ED Triage Vitals  Enc Vitals Group     BP 04/01/19 1545 101/69     Pulse Rate 04/01/19 1545 76     Resp 04/01/19 1545 14     Temp 04/01/19 1545 98.2 F (36.8 C)     Temp Source 04/01/19 1545 Oral     SpO2 04/01/19 1545 100 %     Weight 04/01/19 1542 147 lb (66.7 kg)     Height 04/01/19 1542 5\' 1"  (1.549 m)     Head Circumference --      Peak Flow --      Pain Score 04/01/19 1542 6     Pain Loc --      Pain Edu? --      Excl. in GC? --    No data found.  Updated Vital Signs BP 101/69 (BP Location: Right Arm)   Pulse 76   Temp 98.2 F (36.8 C) (Oral)   Resp 14   Ht  5\' 1"  (1.549 m)   Wt 147 lb (66.7 kg)   SpO2 100%   BMI 27.78 kg/m   Visual Acuity Right Eye Distance:   Left Eye Distance:   Bilateral Distance:    Right Eye Near:   Left Eye Near:    Bilateral Near:     Physical Exam Vitals signs and nursing note reviewed.  Constitutional:      Appearance: Normal appearance.  HENT:     Mouth/Throat:     Mouth: Mucous membranes are moist.  Cardiovascular:     Rate and Rhythm: Normal rate and regular rhythm.     Heart sounds: Normal heart sounds.  Pulmonary:     Effort: Pulmonary effort is normal.     Breath sounds: Normal breath sounds.  Abdominal:     General: Bowel sounds are normal.     Palpations: Abdomen is soft.     Tenderness: There is abdominal tenderness in the right upper quadrant.  Neurological:     Mental Status: She is alert.      UC Treatments / Results  Labs (all labs ordered are listed, but only abnormal results are displayed) Labs Reviewed  PREGNANCY, URINE    EKG None  Radiology No results found.  Procedures Procedures (including critical care time)  Medications Ordered in UC Medications - No data to display  Initial Impression / Assessment and Plan / UC Course  I have reviewed the triage vital signs and the nursing notes.  Pertinent labs & imaging results that were available during my care of the patient were reviewed by me and considered in my medical decision making (see chart for details).     Pt presents with vomiting and loose stools for two days. Pt diagnosed with gastroparesis due to previous presenting and diagnosis. Pt treated with Zofran ad Reglan. NP reviewed concerning side effects of raglan with pt and instructed pt to d/c medication if symptoms present themselves; pt verbalized understanding. Note for work given. All questions answered and all concerns addressed.   Final Clinical Impressions(s) / UC Diagnoses   Final diagnoses:  Gastroparesis  Nausea   Discharge Instructions    None    ED Prescriptions    Medication Sig Dispense Auth. Provider   ondansetron (ZOFRAN) 4 MG tablet Take 1 tablet (4 mg total) by mouth every 8 (eight) hours as needed for up to 7 days for nausea or vomiting. 21  tablet Bailey MechBenjamin, Tricha Ruggirello, NP   metoCLOPramide (REGLAN) 10 MG tablet Take 1 tablet (10 mg total) by mouth every 6 (six) hours for 5 days. 20 tablet Bailey MechBenjamin, Twanna Resh, NP        Bailey MechBenjamin, [place] Santilli, NP 04/01/19 [phone]

## 2019-12-19 ENCOUNTER — Encounter: Payer: Self-pay | Admitting: Emergency Medicine

## 2019-12-19 ENCOUNTER — Other Ambulatory Visit: Payer: Self-pay

## 2019-12-19 ENCOUNTER — Ambulatory Visit
Admission: EM | Admit: 2019-12-19 | Discharge: 2019-12-19 | Disposition: A | Discharge status: Home / Self Care | Payer: Medicaid Other | Attending: Urgent Care | Admitting: Urgent Care

## 2019-12-19 DIAGNOSIS — R81 Glycosuria: Secondary | ICD-10-CM | POA: Insufficient documentation

## 2019-12-19 DIAGNOSIS — Z79890 Hormone replacement therapy (postmenopausal): Secondary | ICD-10-CM | POA: Diagnosis not present

## 2019-12-19 DIAGNOSIS — F17210 Nicotine dependence, cigarettes, uncomplicated: Secondary | ICD-10-CM | POA: Insufficient documentation

## 2019-12-19 DIAGNOSIS — F17200 Nicotine dependence, unspecified, uncomplicated: Secondary | ICD-10-CM | POA: Diagnosis not present

## 2019-12-19 DIAGNOSIS — Z793 Long term (current) use of hormonal contraceptives: Secondary | ICD-10-CM | POA: Diagnosis not present

## 2019-12-19 DIAGNOSIS — R112 Nausea with vomiting, unspecified: Secondary | ICD-10-CM | POA: Diagnosis not present

## 2019-12-19 DIAGNOSIS — Z20822 Contact with and (suspected) exposure to covid-19: Secondary | ICD-10-CM | POA: Diagnosis not present

## 2019-12-19 DIAGNOSIS — R197 Diarrhea, unspecified: Secondary | ICD-10-CM

## 2019-12-19 DIAGNOSIS — Z794 Long term (current) use of insulin: Secondary | ICD-10-CM | POA: Insufficient documentation

## 2019-12-19 DIAGNOSIS — F419 Anxiety disorder, unspecified: Secondary | ICD-10-CM | POA: Insufficient documentation

## 2019-12-19 DIAGNOSIS — E119 Type 2 diabetes mellitus without complications: Secondary | ICD-10-CM | POA: Insufficient documentation

## 2019-12-19 DIAGNOSIS — Z3202 Encounter for pregnancy test, result negative: Secondary | ICD-10-CM

## 2019-12-19 DIAGNOSIS — Z79899 Other long term (current) drug therapy: Secondary | ICD-10-CM | POA: Insufficient documentation

## 2019-12-19 LAB — URINALYSIS, COMPLETE (UACMP) WITH MICROSCOPIC
Bilirubin Urine: NEGATIVE
Glucose, UA: >=500 mg/dL — AB
Hgb urine dipstick: NEGATIVE
Ketones, ur: NEGATIVE mg/dL
Leukocytes,Ua: NEGATIVE
Nitrite: NEGATIVE
Protein, ur: NEGATIVE mg/dL
RBC / HPF: NONE SEEN RBC/hpf (ref 0–5)
Specific Gravity, Urine: 1.015 (ref 1.005–1.030)
pH: 6 (ref 5.0–8.0)

## 2019-12-19 LAB — PREGNANCY, URINE: Preg Test, Ur: NEGATIVE

## 2019-12-19 MED ORDER — ONDANSETRON HCL 4 MG PO TABS: ORAL_TABLET | ORAL | 0 refills | Status: DC

## 2019-12-19 MED ORDER — ONDANSETRON 8 MG PO TBDP
8.0000 mg | ORAL_TABLET | Freq: Once | ORAL | Status: AC
  Administered 2019-12-19: 8 mg via ORAL

## 2019-12-19 NOTE — Discharge Instructions (Addendum)
Stay off dairy, it is OK to take Pepto if the diarrhea comes back. Follow up with your family Dr in 48h if you are not back to normal and your covid test is negative.

## 2019-12-19 NOTE — ED Provider Notes (Signed)
[facility]    CSN: [id number] Arrival date & time: 12/19/19  1857      History   Chief Complaint Chief Complaint  Patient presents with  . Diarrhea  . Nausea    HPI Misty Nelson is a 29 y.o. female. who presents with N and D. Went x 4 the first day, x 8  Yesterday. No diarrhea today. Has been very nauseous today. Does not have an appetive. The pain she has right now came after the diarrhea. Has not been able to work yesterday. Dizziness is present when she sits up.  Has been able to keep gingerale down and vomited some of her water. Vomited once last night and once this am. LMP 3 weeks ago, her explanon is 1 y over due to be removed.  Has been chilling and sweats. Denies URI symptoms. She ate [employer] and the chicken tasted dry. Has HA on temples. Is able to smell and taste. She is a diabetic and is on a sliding scale of insuline, but since she has not eaten today, she did not give herself any insulin. All she had today is a diet soda.  Needs a note for work. She never misses work.   Past Medical History:  Diagnosis Date  . Anxiety   . Type 1 diabetes (HCC)     There are no problems to display for this patient.   Past Surgical History:  Procedure Laterality Date  . INDUCED ABORTION    . LAPAROSCOPY    . NO PAST SURGERIES      OB History   No obstetric history on file.      Home Medications    Prior to Admission medications   Medication Sig Start Date End Date Taking? Authorizing Provider  clonazePAM (KLONOPIN) 1 MG tablet Take 1 mg by mouth 2 (two) times daily.   Yes [provider]  doxepin (SINEQUAN) 10 MG capsule TK 1 C PO HS 12/23/18  Yes [provider]  escitalopram (LEXAPRO) 20 MG tablet TK 1 T PO D 03/01/18  Yes [provider]  Etonogestrel (NEXPLANON [place]) Inject into the skin.   Yes [provider]  gabapentin (NEURONTIN) 600 MG tablet  01/02/19  Yes [provider]  insulin aspart (NOVOLOG) 100  UNIT/ML injection Inject into the skin 3 (three) times daily before meals.   Yes [provider]  insulin glargine (LANTUS) 100 UNIT/ML injection Inject 30 Units into the skin at bedtime.   Yes [provider]  levothyroxine (SYNTHROID, LEVOTHROID) 112 MCG tablet TK 1 T PO D. DOSE ADJUSTMENT BASED ON LATEST LABS. 11/24/18  Yes [provider]  ondansetron (ZOFRAN) 4 MG tablet 1-2 q 6h prn N/V 12/19/19   Rodriguez-Southworth, Sunday Spillers, PA-C  metoCLOPramide (REGLAN) 10 MG tablet Take 1 tablet (10 mg total) by mouth every 6 (six) hours for 5 days. 04/01/19 12/19/19  Gertie Baron, NP    Family History Family History  Problem Relation Age of Onset  . Cirrhosis Mother   . Hypertension Mother     Social History Social History   Tobacco Use  . Smoking status: Current Every Day Smoker    Packs/day: 0.50    Types: Cigarettes  . Smokeless tobacco: Never Used  Substance Use Topics  . Alcohol use: No  . Drug use: No     Allergies   Patient has no known allergies.   Review of Systems Review of Systems  Constitutional: Positive for appetite change, chills, diaphoresis and  fatigue. Negative for fever.  HENT: Negative for congestion, postnasal drip, rhinorrhea, sore throat and trouble swallowing.   Eyes: Negative for photophobia, discharge and visual disturbance.  Respiratory: Negative for cough and shortness of breath.   Cardiovascular: Negative for chest pain and leg swelling.  Gastrointestinal: Positive for abdominal pain, diarrhea, nausea and vomiting. Negative for blood in stool and constipation.       Has intermittent RLQ pain off and on x 2 months  Genitourinary: Positive for difficulty urinating and frequency. Negative for dysuria, urgency and vaginal discharge.       For the past month has trouble emptying her bladder at times  Musculoskeletal: Negative for gait problem and myalgias.  Skin: Negative for rash.   Physical Exam Triage Vital Signs ED  Triage Vitals  Enc Vitals Group     BP 12/19/19 1919 109/72     Pulse Rate 12/19/19 1919 67     Resp 12/19/19 1919 18     Temp 12/19/19 1919 98.4 F (36.9 C)     Temp Source 12/19/19 1919 Oral     SpO2 12/19/19 1919 98 %     Weight 12/19/19 1914 140 lb (63.5 kg)     Height 12/19/19 1914 5\' 1"  (1.549 m)     Head Circumference --      Peak Flow --      Pain Score 12/19/19 1914 7     Pain Loc --      Pain Edu? --      Excl. in GC? --    Orthostatic VS for the past 24 hrs:  BP- Lying Pulse- Lying BP- Sitting Pulse- Sitting BP- Standing at 0 minutes Pulse- Standing at 0 minutes  12/19/19 1951 111/73 78 110/78 72 109/78 80    Updated Vital Signs BP 109/72 (BP Location: Left Arm)   Pulse 67   Temp 98.4 F (36.9 C) (Oral)   Resp 18   Ht 5\' 1"  (1.549 m)   Wt 140 lb (63.5 kg)   SpO2 98%   BMI 26.45 kg/m   Visual Acuity Right Eye Distance:   Left Eye Distance:   Bilateral Distance:    Right Eye Near:   Left Eye Near:    Bilateral Near:     Physical Exam Constitutional:      Appearance: She is ill-appearing. She is not toxic-appearing or diaphoretic.  HENT:     Head: Atraumatic.     Right Ear: External ear normal.     Left Ear: External ear normal.  Eyes:     General: No scleral icterus.    Conjunctiva/sclera: Conjunctivae normal.  Pulmonary:     Effort: Pulmonary effort is normal.  Abdominal:     General: Bowel sounds are normal.     Palpations: Abdomen is soft. There is no mass.     Tenderness: There is abdominal tenderness. There is guarding. There is no right CVA tenderness, left CVA tenderness or rebound.     Hernia: No hernia is present.     Comments: Has mild guarding of RLQ  Only. Has mild to moderate tenderness over suprapubic region.   Musculoskeletal:        General: Normal range of motion.     Cervical back: Neck supple. No rigidity.  Lymphadenopathy:     Cervical: No cervical adenopathy.  Neurological:     Mental Status: She is alert and oriented  to person, place, and time.     Gait: Gait normal.  Psychiatric:  Mood and Affect: Mood normal.        Behavior: Behavior normal.        Thought Content: Thought content normal.        Judgment: Judgment normal.   She checked her glucose before she left and was 336, so she gave herself 3 units of insulin by the time I went in her room to check on her result.  UC Treatments / Results  Labs (all labs ordered are listed, but only abnormal results are displayed) Labs Reviewed  URINALYSIS, COMPLETE (UACMP) WITH MICROSCOPIC - Abnormal; Notable for the following components:      Result Value   Glucose, UA >=500 (*)    Bacteria, UA RARE (*)    All other components within normal limits  SARS CORONAVIRUS 2 (TAT 6-24 HRS)  PREGNANCY, URINE   Pregnancy test is negative EKG   Radiology No results found.  Procedures none Medications Ordered in UC Medications  ondansetron (ZOFRAN-ODT) disintegrating tablet 8 mg (8 mg Oral Given 12/19/19 1949)    Initial Impression / Assessment and Plan / UC Course  I have reviewed the triage vital signs and the nursing notes. Pertinent labs  results that were available during my care of the patient were reviewed by me and considered in my medical decision making (see chart for details). She was advised to Fu with her PCP or GYN about her pelvic pain for the past 2 months, but if it gets better, needs to go to ER for imaging.  She was given Zofran 8 mg ODT which helped her a lot, told to push more fluids like clear electrolyte drinks with no sugar or sugar free.  Needs to try to get some food in her. And also encouraged to stop smoking.  Final Clinical Impressions(s) / UC Diagnoses   Final diagnoses:  Nausea vomiting and diarrhea     Discharge Instructions     Stay off dairy, it is OK to take Pepto if the diarrhea comes back. Follow up with your family Dr in 48h if you are not back to normal and your covid test is negative.     ED  Prescriptions    Medication Sig Dispense Auth. Provider   ondansetron (ZOFRAN) 4 MG tablet 1-2 q 6h prn N/V 20 tablet Rodriguez-Southworth, Nettie Elm, PA-C     PDMP not reviewed this encounter.   Garey Ham, Cordelia Poche 12/19/19 2039

## 2019-12-19 NOTE — ED Triage Notes (Signed)
Pt c/o nausea, lower abdominal pain, diarrhea, started 2 days ago. She feels dizzy, and weak. Denies fever or dysuria.  Denies covid test.

## 2019-12-20 LAB — SARS CORONAVIRUS 2 (TAT 6-24 HRS): SARS Coronavirus 2: NEGATIVE

## 2020-08-04 ENCOUNTER — Encounter: Payer: Self-pay | Admitting: Emergency Medicine

## 2020-08-04 ENCOUNTER — Ambulatory Visit
Admission: EM | Admit: 2020-08-04 | Discharge: 2020-08-04 | Disposition: A | Discharge status: Home / Self Care | Payer: Medicaid Other

## 2020-08-04 ENCOUNTER — Other Ambulatory Visit: Payer: Self-pay

## 2020-08-04 DIAGNOSIS — K0889 Other specified disorders of teeth and supporting structures: Secondary | ICD-10-CM | POA: Diagnosis not present

## 2020-08-04 DIAGNOSIS — K047 Periapical abscess without sinus: Secondary | ICD-10-CM | POA: Diagnosis not present

## 2020-08-04 DIAGNOSIS — K029 Dental caries, unspecified: Secondary | ICD-10-CM

## 2020-08-04 MED ORDER — AMOXICILLIN-POT CLAVULANATE 875-125 MG PO TABS: 1.0000 | ORAL_TABLET | Freq: Two times a day (BID) | ORAL | 0 refills | Status: AC

## 2020-08-04 MED ORDER — TRAMADOL HCL 50 MG PO TABS: 50.0000 mg | ORAL_TABLET | Freq: Four times a day (QID) | ORAL | 0 refills | Status: AC | PRN

## 2020-08-04 NOTE — ED Triage Notes (Signed)
Patient c/o upper left tooth pain that started a week ago.  Patient states that she noticed her tooth was chipped. Patient does not know how that happened.

## 2020-08-04 NOTE — ED Provider Notes (Signed)
[facility]    CSN: [id number] Arrival date & time: 08/04/20  1249      History   Chief Complaint Chief Complaint  Patient presents with  . Dental Pain    HPI Misty Nelson is a 29 y.o. female presenting for dental pain of the left upper back teeth for about a week that has been worsening over the past 3 to 4 days.  She states that she chipped her tooth while eating popcorn about a week ago and has had progressively worsening pain since.  Admits to swelling of the cheek.  Patient is insulin-dependent diabetic.  She denies any associated fever, chills or sweats.  Has tried over-the-counter Orajel, Tylenol and NSAIDs without pain relief.  No other complaints or concerns today.  HPI  Past Medical History:  Diagnosis Date  . Anxiety   . Type 1 diabetes (HCC)     There are no problems to display for this patient.   Past Surgical History:  Procedure Laterality Date  . INDUCED ABORTION    . LAPAROSCOPY    . NO PAST SURGERIES      OB History   No obstetric history on file.      Home Medications    Prior to Admission medications   Medication Sig Start Date End Date Taking? Authorizing Provider  busPIRone (BUSPAR) 10 MG tablet Take 10 mg by mouth 2 (two) times daily. 08/02/20  Yes [provider]  clonazePAM (KLONOPIN) 1 MG tablet Take 1 mg by mouth 2 (two) times daily.   Yes [provider]  escitalopram (LEXAPRO) 20 MG tablet TK 1 T PO D 03/01/18  Yes [provider]  Etonogestrel (NEXPLANON [place]) Inject into the skin.   Yes [provider]  insulin aspart (NOVOLOG) 100 UNIT/ML injection Inject into the skin 3 (three) times daily before meals.   Yes [provider]  insulin glargine (LANTUS) 100 UNIT/ML injection Inject 30 Units into the skin at bedtime.   Yes [provider]  levothyroxine (SYNTHROID, LEVOTHROID) 112 MCG tablet TK 1 T PO D. DOSE ADJUSTMENT BASED ON LATEST LABS. 11/24/18  Yes [provider]  Multiple Vitamin (MULTI-VITAMIN) tablet Take 2 tablets by mouth daily.   Yes [provider]  amoxicillin-clavulanate (AUGMENTIN) 875-125 MG tablet Take 1 tablet by mouth every 12 (twelve) hours for 10 days. 08/04/20 08/14/20  Eusebio Friendly B, PA-C  doxepin (SINEQUAN) 10 MG capsule TK 1 C PO HS 12/23/18   [provider]  gabapentin (NEURONTIN) 600 MG tablet  01/02/19   [provider]  ondansetron (ZOFRAN) 4 MG tablet 1-2 q 6h prn N/V 12/19/19   Rodriguez-Southworth, Nettie Elm, PA-C  traMADol (ULTRAM) 50 MG tablet Take 1 tablet (50 mg total) by mouth every 6 (six) hours as needed for up to 3 days. 08/04/20 08/07/20  Shirlee Latch, PA-C  metoCLOPramide (REGLAN) 10 MG tablet Take 1 tablet (10 mg total) by mouth every 6 (six) hours for 5 days. 04/01/19 12/19/19  Bailey Mech, NP    Family History Family History  Problem Relation Age of Onset  . Cirrhosis Mother   . Hypertension Mother     Social History Social History   Tobacco Use  . Smoking status: Current Every Day Smoker    Packs/day: 0.50    Types: Cigarettes  . Smokeless tobacco: Never Used  Vaping Use  . Vaping Use: Never used  Substance Use Topics  . Alcohol use: No  . Drug use: No  Allergies   Patient has no known allergies.   Review of Systems Review of Systems  Constitutional: Negative for fatigue and fever.  HENT: Positive for dental problem and facial swelling. Negative for sore throat and trouble swallowing.   Skin: Negative for wound.  Neurological: Negative for weakness and headaches.  Hematological: Positive for adenopathy.     Physical Exam Triage Vital Signs ED Triage Vitals [08/04/20 1319]  Enc Vitals Group     BP 100/73     Pulse Rate 67     Resp 14     Temp 98.1 F (36.7 C)     Temp Source Oral     SpO2 98 %     Weight 138 lb (62.6 kg)     Height 5\' 2"  (1.575 m)     Head Circumference      Peak Flow      Pain Score 9     Pain Loc      Pain  Edu?      Excl. in GC?    No data found.  Updated Vital Signs BP 100/73 (BP Location: Left Arm)   Pulse 67   Temp 98.1 F (36.7 C) (Oral)   Resp 14   Ht 5\' 2"  (1.575 m)   Wt 138 lb (62.6 kg)   SpO2 98%   BMI 25.24 kg/m       Physical Exam Vitals and nursing note reviewed.  Constitutional:      General: She is not in acute distress.    Appearance: Normal appearance. She is not ill-appearing or toxic-appearing.  HENT:     Head: Normocephalic and atraumatic.     Nose: Nose normal.     Mouth/Throat:     Mouth: Mucous membranes are moist.     Dentition: Abnormal dentition (fractured #15 with surrounding erythema, swellingn and tenderness ). Dental tenderness, gingival swelling and dental caries (multiple diffuse dental caries) present.     Pharynx: Oropharynx is clear.  Eyes:     General: No scleral icterus.       Right eye: No discharge.        Left eye: No discharge.     Conjunctiva/sclera: Conjunctivae normal.  Cardiovascular:     Rate and Rhythm: Normal rate and regular rhythm.  Pulmonary:     Effort: Pulmonary effort is normal. No respiratory distress.  Musculoskeletal:     Cervical back: Neck supple.  Lymphadenopathy:     Cervical: Cervical adenopathy (left anterior) present.  Skin:    General: Skin is dry.  Neurological:     General: No focal deficit present.     Mental Status: She is alert. Mental status is at baseline.     Motor: No weakness.     Gait: Gait normal.  Psychiatric:        Mood and Affect: Mood normal.        Behavior: Behavior normal.        Thought Content: Thought content normal.      UC Treatments / Results  Labs (all labs ordered are listed, but only abnormal results are displayed) Labs Reviewed - No data to display  EKG   Radiology No results found.  Procedures Procedures (including critical care time)  Medications Ordered in UC Medications - No data to display  Initial Impression / Assessment and Plan / UC Course  I  have reviewed the triage vital signs and the nursing notes.  Pertinent labs & imaging results that were available during my  care of the patient were reviewed by me and considered in my medical decision making (see chart for details).   On exam, patient has broken tooth with subsequent secondary infection.  Treating at this time with Augmentin.  Also advised rest, icing the cheek, NSAIDs and Tylenol for pain.  If those measures do not help the pain, will provide a short-term supply of tramadol.  Controlled substance database reviewed and patient low risk for abuse.  Advised her to find a dentist as she may need to have root canal and crown or tooth removed.  Patient understanding and agreeable.  Plans to call to find a dentist tomorrow.   Final Clinical Impressions(s) / UC Diagnoses   Final diagnoses:  Infected dental caries  Tooth pain     Discharge Instructions     DENTAL INFECTION: Follow up with dentist at the next available appointment.  In the meantime, we will cover for dental infection with Augmentin.  Take NSAIDs/Tylenol for pain relief. Consider Orajel also. May ice the area. Follow up with dentist in the next few days. In some cases, the tooth may needed to be extracted. Follow up with [place] or ER sooner if the condition worsens before the dental appointment. If they develop a fever, significant soft tissue swelling, or worse pain, go to ER     ED Prescriptions    Medication Sig Dispense Auth. Provider   amoxicillin-clavulanate (AUGMENTIN) 875-125 MG tablet Take 1 tablet by mouth every 12 (twelve) hours for 10 days. 20 tablet Eusebio Friendly B, PA-C   traMADol (ULTRAM) 50 MG tablet Take 1 tablet (50 mg total) by mouth every 6 (six) hours as needed for up to 3 days. 8 tablet Shirlee Latch, PA-C     I have reviewed the PDMP during this encounter.   Shirlee Latch, PA-C 08/05/20 2008

## 2020-08-04 NOTE — Discharge Instructions (Addendum)
DENTAL INFECTION: Follow up with dentist at the next available appointment.  In the meantime, we will cover for dental infection with Augmentin.  Take NSAIDs/Tylenol for pain relief. Consider Orajel also. May ice the area. Follow up with dentist in the next few days. In some cases, the tooth may needed to be extracted. Follow up with us or ER sooner if the condition worsens before the dental appointment. If they develop a fever, significant soft tissue swelling, or worse pain, go to ER  

## 2021-07-12 ENCOUNTER — Other Ambulatory Visit: Payer: Self-pay

## 2021-07-12 ENCOUNTER — Ambulatory Visit
Admission: EM | Admit: 2021-07-12 | Discharge: 2021-07-12 | Disposition: A | Discharge status: Home / Self Care | Payer: Medicaid Other | Attending: Family Medicine | Admitting: Family Medicine

## 2021-07-12 DIAGNOSIS — R112 Nausea with vomiting, unspecified: Secondary | ICD-10-CM

## 2021-07-12 MED ORDER — ONDANSETRON 4 MG PO TBDP: 4.0000 mg | ORAL_TABLET | Freq: Three times a day (TID) | ORAL | 0 refills | Status: DC | PRN

## 2021-07-12 MED ORDER — METOCLOPRAMIDE HCL 10 MG PO TABS: 10.0000 mg | ORAL_TABLET | Freq: Three times a day (TID) | ORAL | 0 refills | Status: DC

## 2021-07-12 NOTE — ED Triage Notes (Signed)
Pt c/o nausea, vomiting, abdominal pain and cramping for about a week. Pt denies fever or other symptoms. Pt does have history of gastroparesis. Pt is unsure if she could be pregnant, she does have an implant which she has had for 3 years. Pt also states she was recently diagnosed with trichomonas and only took 1 dose of the medication because it made her nauseous.

## 2021-07-12 NOTE — ED Provider Notes (Signed)
[facility]    CSN: [id number] Arrival date & time: 07/12/21  1516      History   Chief Complaint Chief Complaint  Patient presents with   Nausea   Emesis    HPI  30 year old female presents with the above complaints.  Patient has uncontrolled type 1 diabetes.  Patient states that she has had nausea, vomiting, and associated abdominal cramping and pain for the past week.  She has a history of similar presentations.  Prior encounters suggest diagnosis of gastroparesis.  She has been on medication for this in the past but not recently.  Patient rates her pain a 6/10 in severity.  No fever.  No relieving factors.  Past Medical History:  Diagnosis Date   Anxiety    Type 1 diabetes (HCC)    Past Surgical History:  Procedure Laterality Date   INDUCED ABORTION     LAPAROSCOPY     NO PAST SURGERIES      OB History   No obstetric history on file.      Home Medications    Prior to Admission medications   Medication Sig Start Date End Date Taking? Authorizing Provider  busPIRone (BUSPAR) 10 MG tablet Take 10 mg by mouth 2 (two) times daily. 08/02/20  Yes [provider]  clonazePAM (KLONOPIN) 1 MG tablet Take by mouth. 08/30/20  Yes [provider]  doxepin (SINEQUAN) 10 MG capsule TK 1 C PO HS 12/23/18  Yes [provider]  escitalopram (LEXAPRO) 20 MG tablet TK 1 T PO D 03/01/18  Yes [provider]  Etonogestrel (NEXPLANON [place]) Inject into the skin.   Yes [provider]  gabapentin (NEURONTIN) 600 MG tablet  01/02/19  Yes [provider]  GVOKE HYPOPEN 2-PACK 1 MG/0.2ML SOAJ SMARTSIG:1 Milligram(s) SUB-Q PRN 06/17/21  Yes [provider]  insulin aspart (NOVOLOG) 100 UNIT/ML injection Inject into the skin 3 (three) times daily before meals.   Yes [provider]  insulin glargine (LANTUS) 100 UNIT/ML injection Inject 30 Units into the skin at bedtime.   Yes [provider]   levothyroxine (SYNTHROID) 125 MCG tablet Take 125 mcg by mouth daily. 06/17/21  Yes [provider]  metoCLOPramide (REGLAN) 10 MG tablet Take 1 tablet (10 mg total) by mouth 3 (three) times daily before meals. 07/12/21  Yes Everlene Other G, DO  Multiple Vitamin (MULTI-VITAMIN) tablet Take 2 tablets by mouth daily.   Yes [provider]  ondansetron (ZOFRAN ODT) 4 MG disintegrating tablet Take 1 tablet (4 mg total) by mouth every 8 (eight) hours as needed for nausea or vomiting. 07/12/21  Yes Tommie Sams, DO  ondansetron (ZOFRAN) 4 MG tablet 1-2 q 6h prn N/V 12/19/19  Yes Rodriguez-Southworth, Nettie Elm, PA-C    Family History Family History  Problem Relation Age of Onset   Cirrhosis Mother    Hypertension Mother     Social History Social History   Tobacco Use   Smoking status: Every Day    Packs/day: 0.50    Types: Cigarettes   Smokeless tobacco: Never  Vaping Use   Vaping Use: Never used  Substance Use Topics   Alcohol use: No   Drug use: No     Allergies   Patient has no known allergies.   Review of Systems Review of Systems  Constitutional:  Positive for appetite change.  Gastrointestinal:  Positive for abdominal pain, nausea and vomiting.   Physical Exam Triage Vital Signs ED Triage Vitals  Enc  Vitals Group     BP 07/12/21 1527 121/77     Pulse Rate 07/12/21 1527 79     Resp 07/12/21 1527 18     Temp 07/12/21 1527 98.1 F (36.7 C)     Temp Source 07/12/21 1527 Oral     SpO2 07/12/21 1527 100 %     Weight 07/12/21 1525 135 lb (61.2 kg)     Height 07/12/21 1525 5\' 1"  (1.549 m)     Head Circumference --      Peak Flow --      Pain Score 07/12/21 1525 6     Pain Loc --      Pain Edu? --      Excl. in GC? --    Updated Vital Signs BP 121/77 (BP Location: Left Arm)   Pulse 79   Temp 98.1 F (36.7 C) (Oral)   Resp 18   Ht 5\' 1"  (1.549 m)   Wt 61.2 kg   SpO2 100%   BMI 25.51 kg/m   Visual Acuity Right Eye Distance:   Left Eye  Distance:   Bilateral Distance:    Right Eye Near:   Left Eye Near:    Bilateral Near:     Physical Exam Vitals and nursing note reviewed.  Constitutional:      General: She is not in acute distress.    Appearance: She is not ill-appearing.  HENT:     Head: Normocephalic and atraumatic.     Mouth/Throat:     Mouth: Mucous membranes are moist.     Pharynx: Oropharynx is clear.  Eyes:     General:        Right eye: No discharge.        Left eye: No discharge.     Conjunctiva/sclera: Conjunctivae normal.  Cardiovascular:     Rate and Rhythm: Normal rate and regular rhythm.  Pulmonary:     Effort: Pulmonary effort is normal.     Breath sounds: Normal breath sounds. No wheezing or rales.  Abdominal:     General: There is no distension.     Palpations: Abdomen is soft.     Tenderness: There is no abdominal tenderness.  Neurological:     Mental Status: She is alert.     UC Treatments / Results  Labs (all labs ordered are listed, but only abnormal results are displayed) Labs Reviewed - No data to display  EKG   Radiology No results found.  Procedures Procedures (including critical care time)  Medications Ordered in UC Medications - No data to display  Initial Impression / Assessment and Plan / UC Course  I have reviewed the triage vital signs and the nursing notes.  Pertinent labs & imaging results that were available during my care of the patient were reviewed by me and considered in my medical decision making (see chart for details).    30 year old female presents with nausea and vomiting and associated abdominal pain.  Suspect gastroparesis.  Patient also is a regular user of marijuana.  This could be playing a role as well.  Advised to avoid marijuana use.  Reglan as prescribed.  Zofran if needed as well.  Supportive care.  Final Clinical Impressions(s) / UC Diagnoses   Final diagnoses:  Nausea and vomiting, unspecified vomiting type     Discharge  Instructions      Push fluids.  Medications as prescribed.  Please follow-up with your endocrinologist.  Take care  Dr.    ED  Prescriptions     Medication Sig Dispense Auth. Provider   metoCLOPramide (REGLAN) 10 MG tablet Take 1 tablet (10 mg total) by mouth 3 (three) times daily before meals. 30 tablet Rudi Bunyard G, DO   ondansetron (ZOFRAN ODT) 4 MG disintegrating tablet Take 1 tablet (4 mg total) by mouth every 8 (eight) hours as needed for nausea or vomiting. 20 tablet Tommie Sams, DO      PDMP not reviewed this encounter.   Tommie Sams, [place] 07/12/21 1645

## 2021-07-12 NOTE — Discharge Instructions (Signed)
Push fluids.  Medications as prescribed.  Please follow-up with your endocrinologist.  Take care  Dr. Adriana Simas

## 2023-01-08 ENCOUNTER — Ambulatory Visit
Admission: EM | Admit: 2023-01-08 | Discharge: 2023-01-08 | Disposition: A | Discharge status: Home / Self Care | Payer: Medicaid Other | Attending: Internal Medicine | Admitting: Internal Medicine

## 2023-01-08 DIAGNOSIS — L03211 Cellulitis of face: Secondary | ICD-10-CM | POA: Diagnosis not present

## 2023-01-08 MED ORDER — SULFAMETHOXAZOLE-TRIMETHOPRIM 800-160 MG PO TABS: 1.0000 | ORAL_TABLET | Freq: Two times a day (BID) | ORAL | 0 refills | Status: AC

## 2023-01-08 MED ORDER — IBUPROFEN 600 MG PO TABS: 600.0000 mg | ORAL_TABLET | Freq: Four times a day (QID) | ORAL | 0 refills | Status: DC | PRN

## 2023-01-08 MED ORDER — KETOROLAC TROMETHAMINE 30 MG/ML IJ SOLN
30.0000 mg | Freq: Once | INTRAMUSCULAR | Status: AC
  Administered 2023-01-08: 30 mg via INTRAMUSCULAR

## 2023-01-08 NOTE — Discharge Instructions (Addendum)
Please take medications as prescribed Please do not squeeze the abscess Please apply topical antibiotic ointment If you have worsening headache, doublevision, visual changes, fever, chills or confusion or worsening redness or swelling-please go to the emergency department immediately to be evaluated.

## 2023-01-08 NOTE — ED Triage Notes (Signed)
Pt is with her fiance   Pt c/o insect bite to right side of nosex3days  Pt states that it looked like a pimple and tried to pop it the first night. Pt removed the scab and noticed a hole under the bite with green coloration that would not drain. Pt has had facial swelling on right side of face and redness surrounding bump on the side of nose.

## 2023-01-08 NOTE — ED Provider Notes (Signed)
[facility]    CSN: [id number] Arrival date & time: 01/08/23  1300      History   Chief Complaint Chief Complaint  Patient presents with   Insect Bite    HPI Misty Nelson is a 32 y.o. female with a history of diabetes mellitus type 1 and history of IV drug use comes to the urgent care with a 3-day history of facial pain with redness.  Patient noticed a pimple on her face a few days ago.  She squeezed the pimple and following that she noticed worsening pain greenish discharge and worsening redness.  She suspects that she may have been bitten by a spider.  She denies any fever or chills.  Patient has developed headache mainly on the right retro-orbital area.  Headache is aggravated by light.  No known relieving factors.  No aura or floaters in the visual field.  Patient has a history of migraines.  No difficulty with his speech.  No numbness or tingling.  No facial deviation.Marland Kitchen.   HPI  Past Medical History:  Diagnosis Date   Anxiety    Type 1 diabetes     There are no problems to display for this patient.   Past Surgical History:  Procedure Laterality Date   INDUCED ABORTION     LAPAROSCOPY     NO PAST SURGERIES      OB History   No obstetric history on file.      Home Medications    Prior to Admission medications   Medication Sig Start Date End Date Taking? Authorizing Provider  clonazePAM (KLONOPIN) 1 MG tablet Take by mouth. 08/30/20  Yes [provider]  Etonogestrel (NEXPLANON [place]) Inject into the skin.   Yes [provider]  ibuprofen (ADVIL) 600 MG tablet Take 1 tablet (600 mg total) by mouth every 6 (six) hours as needed. 01/08/23  Yes Jari Carollo, Britta MccreedyPhilip O, MD  insulin aspart (NOVOLOG) 100 UNIT/ML injection Inject into the skin 3 (three) times daily before meals.   Yes [provider]  insulin glargine (LANTUS) 100 UNIT/ML injection Inject 30 Units into the skin at bedtime.   Yes [provider]   sulfamethoxazole-trimethoprim (BACTRIM DS) 800-160 MG tablet Take 1 tablet by mouth 2 (two) times daily for 7 days. 01/08/23 01/15/23 Yes Yariela Tison, Britta MccreedyPhilip O, MD  busPIRone (BUSPAR) 10 MG tablet Take 10 mg by mouth 2 (two) times daily. 08/02/20   [provider]  doxepin (SINEQUAN) 10 MG capsule TK 1 C PO HS 12/23/18   [provider]  escitalopram (LEXAPRO) 20 MG tablet TK 1 T PO D 03/01/18   [provider]  gabapentin (NEURONTIN) 600 MG tablet  01/02/19   [provider]  GVOKE HYPOPEN 2-PACK 1 MG/0.2ML SOAJ SMARTSIG:1 Milligram(s) SUB-Q PRN 06/17/21   [provider]  levothyroxine (SYNTHROID) 125 MCG tablet Take 125 mcg by mouth daily. 06/17/21   [provider]  metoCLOPramide (REGLAN) 10 MG tablet Take 1 tablet (10 mg total) by mouth 3 (three) times daily before meals. 07/12/21   Tommie Samsook, Jayce G, DO  Multiple Vitamin (MULTI-VITAMIN) tablet Take 2 tablets by mouth daily.    [provider]  ondansetron (ZOFRAN ODT) 4 MG disintegrating tablet Take 1 tablet (4 mg total) by mouth every 8 (eight) hours as needed for nausea or vomiting. 07/12/21   Tommie Samsook, Jayce G, DO  ondansetron (ZOFRAN) 4 MG tablet 1-2 q 6h prn N/V 12/19/19   Rodriguez-Southworth, Nettie ElmSylvia, PA-C    Family  History Family History  Problem Relation Age of Onset   Cirrhosis Mother    Hypertension Mother     Social History Social History   Tobacco Use   Smoking status: Every Day    Packs/day: .5    Types: Cigarettes   Smokeless tobacco: Never  Vaping Use   Vaping Use: Never used  Substance Use Topics   Alcohol use: No   Drug use: No     Allergies   Patient has no known allergies.   Review of Systems Review of Systems As per HPI  Physical Exam Triage Vital Signs ED Triage Vitals  Enc Vitals Group     BP 01/08/23 1310 106/75     Pulse Rate 01/08/23 1310 91     Resp --      Temp 01/08/23 1310 97.8 F (36.6 C)     Temp Source 01/08/23 1310 Oral     SpO2  01/08/23 1310 96 %     Weight 01/08/23 1308 122 lb (55.3 kg)     Height 01/08/23 1308 5\' 1"  (1.549 m)     Head Circumference --      Peak Flow --      Pain Score 01/08/23 1308 10     Pain Loc --      Pain Edu? --      Excl. in GC? --    No data found.  Updated Vital Signs BP 106/75 (BP Location: Left Arm)   Pulse 91   Temp 97.8 F (36.6 C) (Oral)   Ht 5\' 1"  (1.549 m)   Wt 55.3 kg   LMP 01/01/2023   SpO2 96%   BMI 23.05 kg/m   Visual Acuity Right Eye Distance:   Left Eye Distance:   Bilateral Distance:    Right Eye Near:   Left Eye Near:    Bilateral Near:     Physical Exam Vitals and nursing note reviewed.  Constitutional:      General: She is in acute distress.     Appearance: Normal appearance. She is ill-appearing.  HENT:     Right Ear: Tympanic membrane normal.     Left Ear: Tympanic membrane normal.     Nose:     Comments: Tender swelling on the nose.  Area of induration and swelling measures about 1 inch in the longest diameter.  It is nonfluctuant.  There is overlying erythema.  No discharge noted. Cardiovascular:     Rate and Rhythm: Normal rate and regular rhythm.  Neurological:     General: No focal deficit present.     Mental Status: She is alert and oriented to person, place, and time. Mental status is at baseline.     Cranial Nerves: No cranial nerve deficit.     Sensory: No sensory deficit.     Motor: No weakness.     Coordination: Coordination normal.     Gait: Gait normal.     Deep Tendon Reflexes: Reflexes normal.      UC Treatments / Results  Labs (all labs ordered are listed, but only abnormal results are displayed) Labs Reviewed - No data to display  EKG   Radiology No results found.  Procedures Procedures (including critical care time)  Medications Ordered in UC Medications  ketorolac (TORADOL) 30 MG/ML injection 30 mg (30 mg Intramuscular Given 01/08/23 1338)    Initial Impression / Assessment and Plan / UC Course  I  have reviewed the triage vital signs and the nursing notes.  Pertinent  labs & imaging results that were available during my care of the patient were reviewed by me and considered in my medical decision making (see chart for details).     1.  Cellulitis and abscess of the face: Abscess is draining Patient has no neurodeficits Bactrim double strength twice daily for 7 days Ibuprofen as needed for pain and/or fever  2.  Right-sided headache consistent with acute migraine: Toradol 30 mg IM x 1 dose Ibuprofen as needed for pain Return to ED precautions given. Final Clinical Impressions(s) / UC Diagnoses   Final diagnoses:  Cellulitis of face     Discharge Instructions      Please take medications as prescribed Please do not squeeze the abscess Please apply topical antibiotic ointment If you have worsening headache, doublevision, visual changes, fever, chills or confusion or worsening redness or swelling-please go to the emergency department immediately to be evaluated.   ED Prescriptions     Medication Sig Dispense Auth. Provider   sulfamethoxazole-trimethoprim (BACTRIM DS) 800-160 MG tablet Take 1 tablet by mouth 2 (two) times daily for 7 days. 14 tablet Shyquan Stallbaumer, Britta Mccreedy, MD   ibuprofen (ADVIL) 600 MG tablet Take 1 tablet (600 mg total) by mouth every 6 (six) hours as needed. 30 tablet Rhemi Balbach, Britta Mccreedy, MD      PDMP not reviewed this encounter.   Merrilee Jansky, MD 01/08/23 [phone]

## 2023-07-31 ENCOUNTER — Encounter: Payer: Self-pay | Admitting: Emergency Medicine

## 2023-07-31 ENCOUNTER — Ambulatory Visit
Admission: EM | Admit: 2023-07-31 | Discharge: 2023-07-31 | Disposition: A | Discharge status: Home / Self Care | Payer: MEDICAID | Attending: Family Medicine | Admitting: Family Medicine

## 2023-07-31 DIAGNOSIS — K529 Noninfective gastroenteritis and colitis, unspecified: Secondary | ICD-10-CM | POA: Diagnosis not present

## 2023-07-31 MED ORDER — ONDANSETRON 4 MG PO TBDP: 4.0000 mg | ORAL_TABLET | Freq: Three times a day (TID) | ORAL | 0 refills | Status: AC | PRN

## 2023-07-31 NOTE — ED Triage Notes (Signed)
Patient reports vomiting and diarrhea that started yesterday.  Patient states that she was unable to go into work last night.  Patient states that she took a sleeping pill around 5 am this morning.  Patient states that when she woke up later today, she still has some nausea and stomach pain.  Patient denies recent vomiting.  Patient states that she is suppose to go to work tonight but needs a work note.  Patient denies fevers.

## 2023-07-31 NOTE — Discharge Instructions (Signed)
Ondansetron dissolved in the mouth every 8 hours as needed for nausea or vomiting. Clear liquids(water, gatorade/pedialyte, ginger ale/sprite, chicken broth/soup) and bland things(crackers/toast, rice, potato, bananas) to eat. Avoid acidic foods like lemon/lime/orange/tomato, and avoid greasy/spicy foods.

## 2023-07-31 NOTE — ED Provider Notes (Signed)
[facility]    CSN: [id number] Arrival date & time: 07/31/23  1537      History   Chief Complaint Chief Complaint  Patient presents with   Emesis   Diarrhea    HPI Misty Nelson is a 32 y.o. female.    Emesis Associated symptoms: diarrhea   Diarrhea Associated symptoms: vomiting   With nausea and vomiting and diarrhea. Last evening she began having nausea and then threw up about 5 times overnight, the last time being about 13 hours ago.  She is still nauseated.  She had some significant cramping overnight but that is improved, though now her stomach just feels sore.  She started having loose stools this morning and is had about 3 or 4 loose stools.  No blood in the emesis or stool  No fever or chills and no cough or congestion.  Last menstrual cycle was in the last week.   Past Medical History:  Diagnosis Date   Anxiety    Type 1 diabetes (HCC)     There are no problems to display for this patient.   Past Surgical History:  Procedure Laterality Date   INDUCED ABORTION     LAPAROSCOPY     NO PAST SURGERIES      OB History   No obstetric history on file.      Home Medications    Prior to Admission medications   Medication Sig Start Date End Date Taking? Authorizing Provider  clonazePAM (KLONOPIN) 1 MG tablet Take by mouth. 08/30/20  Yes [provider]  doxepin (SINEQUAN) 10 MG capsule TK 1 C PO HS 12/23/18  Yes [provider]  escitalopram (LEXAPRO) 20 MG tablet TK 1 T PO D 03/01/18  Yes [provider]  insulin aspart (NOVOLOG) 100 UNIT/ML injection Inject into the skin 3 (three) times daily before meals.   Yes [provider]  insulin glargine (LANTUS) 100 UNIT/ML injection Inject 30 Units into the skin at bedtime.   Yes [provider]  ondansetron (ZOFRAN-ODT) 4 MG disintegrating tablet Take 1 tablet (4 mg total) by mouth every 8 (eight) hours as needed for nausea or vomiting. 07/31/23   Yes Zenia Resides, MD  Etonogestrel [facility]) Inject into the skin.    [provider]  GVOKE HYPOPEN 2-PACK 1 MG/0.2ML SOAJ SMARTSIG:1 Milligram(s) SUB-Q PRN 06/17/21   [provider]  levothyroxine (SYNTHROID) 125 MCG tablet Take 125 mcg by mouth daily. 06/17/21   [provider]  Multiple Vitamin (MULTI-VITAMIN) tablet Take 2 tablets by mouth daily.    [provider]    Family History Family History  Problem Relation Age of Onset   Cirrhosis Mother    Hypertension Mother     Social History Social History   Tobacco Use   Smoking status: Every Day    Current packs/day: 0.50    Types: Cigarettes   Smokeless tobacco: Never  Vaping Use   Vaping status: Never Used  Substance Use Topics   Alcohol use: No   Drug use: No     Allergies   Patient has no known allergies.   Review of Systems Review of Systems  Gastrointestinal:  Positive for diarrhea and vomiting.     Physical Exam Triage Vital Signs ED Triage Vitals  Encounter Vitals Group     BP 07/31/23 1551 117/76     Systolic BP Percentile --      Diastolic BP Percentile --      Pulse  Rate 07/31/23 1551 86     Resp 07/31/23 1551 14     Temp 07/31/23 1551 98.1 F (36.7 C)     Temp Source 07/31/23 1551 Oral     SpO2 07/31/23 1551 96 %     Weight 07/31/23 1548 138 lb (62.6 kg)     Height 07/31/23 1548 5\' 1"  (1.549 m)     Head Circumference --      Peak Flow --      Pain Score 07/31/23 1548 6     Pain Loc --      Pain Education --      Exclude from Growth Chart --    No data found.  Updated Vital Signs BP 117/76 (BP Location: Right Arm)   Pulse 86   Temp 98.1 F (36.7 C) (Oral)   Resp 14   Ht 5\' 1"  (1.549 m)   Wt 62.6 kg   SpO2 96%   BMI 26.07 kg/m   Visual Acuity Right Eye Distance:   Left Eye Distance:   Bilateral Distance:    Right Eye Near:   Left Eye Near:    Bilateral Near:     Physical Exam Vitals reviewed.  Constitutional:       General: She is not in acute distress.    Appearance: She is not ill-appearing, toxic-appearing or diaphoretic.  HENT:     Mouth/Throat:     Mouth: Mucous membranes are moist.  Eyes:     Extraocular Movements: Extraocular movements intact.     Pupils: Pupils are equal, round, and reactive to light.  Cardiovascular:     Rate and Rhythm: Normal rate and regular rhythm.     Heart sounds: No murmur heard. Pulmonary:     Effort: Pulmonary effort is normal.     Breath sounds: Normal breath sounds.  Abdominal:     General: There is no distension.     Palpations: Abdomen is soft.     Tenderness: There is no guarding.     Comments: Tenderness generalized  Musculoskeletal:     Cervical back: Neck supple.  Lymphadenopathy:     Cervical: No cervical adenopathy.  Skin:    Coloration: Skin is not jaundiced or pale.  Neurological:     General: No focal deficit present.     Mental Status: She is alert and oriented to person, place, and time.  Psychiatric:        Behavior: Behavior normal.      UC Treatments / Results  Labs (all labs ordered are listed, but only abnormal results are displayed) Labs Reviewed - No data to display  EKG   Radiology No results found.  Procedures Procedures (including critical care time)  Medications Ordered in UC Medications - No data to display  Initial Impression / Assessment and Plan / UC Course  I have reviewed the triage vital signs and the nursing notes.  Pertinent labs & imaging results that were available during my care of the patient were reviewed by me and considered in my medical decision making (see chart for details).     Zofran is sent in to the pharmacy, and we discussed clear liquids and bland foods. Work note provided Final Clinical Impressions(s) / UC Diagnoses   Final diagnoses:  Gastroenteritis     Discharge Instructions      Ondansetron dissolved in the mouth every 8 hours as needed for nausea or vomiting. Clear  liquids(water, gatorade/pedialyte, ginger ale/sprite, chicken broth/soup) and bland things(crackers/toast, rice, potato, bananas)  to eat. Avoid acidic foods like lemon/lime/orange/tomato, and avoid greasy/spicy foods.      ED Prescriptions     Medication Sig Dispense Auth. Provider   ondansetron (ZOFRAN-ODT) 4 MG disintegrating tablet Take 1 tablet (4 mg total) by mouth every 8 (eight) hours as needed for nausea or vomiting. 10 tablet Marlinda Mike Janace Aris, MD      PDMP not reviewed this encounter.   Zenia Resides, MD 07/31/23 [phone]
